# Patient Record
Sex: Male | Born: 1960 | Race: White | Hispanic: No | Marital: Married | Smoking: Never smoker
Health system: Southern US, Community
[De-identification: ages and names within clinical notes are randomized; demographics above are authoritative.]

## PROBLEM LIST (undated history)

## (undated) DIAGNOSIS — F329 Major depressive disorder, single episode, unspecified: Secondary | ICD-10-CM

## (undated) DIAGNOSIS — I10 Essential (primary) hypertension: Secondary | ICD-10-CM

## (undated) DIAGNOSIS — E785 Hyperlipidemia, unspecified: Secondary | ICD-10-CM

## (undated) DIAGNOSIS — K219 Gastro-esophageal reflux disease without esophagitis: Secondary | ICD-10-CM

## (undated) DIAGNOSIS — F528 Other sexual dysfunction not due to a substance or known physiological condition: Secondary | ICD-10-CM

## (undated) HISTORY — DX: Essential (primary) hypertension: I10

## (undated) HISTORY — DX: Gastro-esophageal reflux disease without esophagitis: K21.9

## (undated) HISTORY — DX: Major depressive disorder, single episode, unspecified: F32.9

## (undated) HISTORY — DX: Other sexual dysfunction not due to a substance or known physiological condition: F52.8

## (undated) HISTORY — DX: Hyperlipidemia, unspecified: E78.5

---

## 2000-04-10 ENCOUNTER — Ambulatory Visit (HOSPITAL_COMMUNITY): Admission: RE | Admit: 2000-04-10 | Discharge: 2000-04-10 | Payer: Self-pay | Admitting: Specialist

## 2000-04-10 ENCOUNTER — Encounter: Payer: Self-pay | Admitting: Specialist

## 2004-07-05 ENCOUNTER — Ambulatory Visit (HOSPITAL_COMMUNITY): Admission: RE | Admit: 2004-07-05 | Discharge: 2004-07-05 | Payer: Self-pay | Admitting: Urology

## 2004-07-27 ENCOUNTER — Emergency Department (HOSPITAL_COMMUNITY): Admission: EM | Admit: 2004-07-27 | Discharge: 2004-07-27 | Payer: Self-pay | Admitting: Emergency Medicine

## 2004-08-15 HISTORY — PX: LITHOTRIPSY: SUR834

## 2004-08-24 ENCOUNTER — Inpatient Hospital Stay (HOSPITAL_COMMUNITY): Admission: RE | Admit: 2004-08-24 | Discharge: 2004-08-27 | Payer: Self-pay | Admitting: Urology

## 2004-08-30 ENCOUNTER — Ambulatory Visit (HOSPITAL_COMMUNITY): Admission: RE | Admit: 2004-08-30 | Discharge: 2004-08-30 | Payer: Self-pay | Admitting: Urology

## 2005-03-18 ENCOUNTER — Encounter: Admission: RE | Admit: 2005-03-18 | Discharge: 2005-03-18 | Payer: Self-pay | Admitting: Occupational Medicine

## 2005-06-01 ENCOUNTER — Ambulatory Visit: Payer: Self-pay | Admitting: Internal Medicine

## 2005-06-13 ENCOUNTER — Ambulatory Visit: Payer: Self-pay | Admitting: Internal Medicine

## 2005-08-18 IMAGING — RF DG NEPHROSTOGRAM LEFT
14 of 22 series · 14 of 22 positions shown · non-contrast
Comparison: Left nephrostomy tube placement 08/24/2004.

CLINICAL DATA: 43 year-old male status-post left percutaneous nephrolithotomy 08/24/2004.  Request to perform left nephrostogram and if within normal limits remove 22 French council tip catheter.
LEFT NEPHROSTOGRAM ? 08/31/2003:

[Series 1: run · 1 of 1 slices shown (1 of 13)]
[im 1/1]
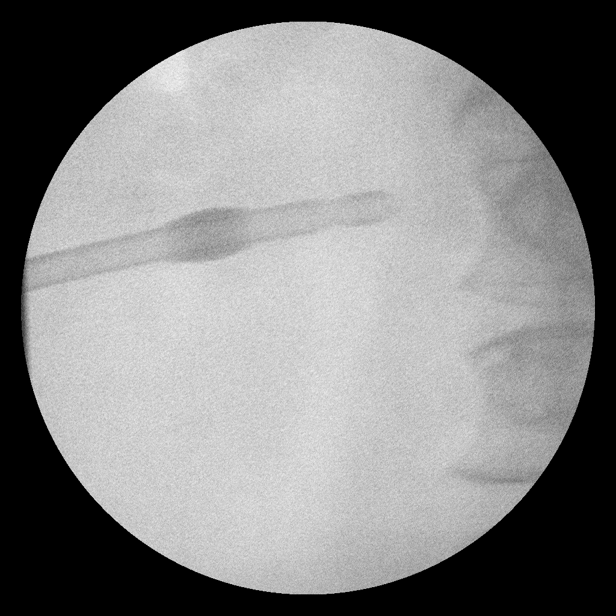

[Series 3: run · 1 of 1 slices shown (2 of 13)]
[im 1/1]
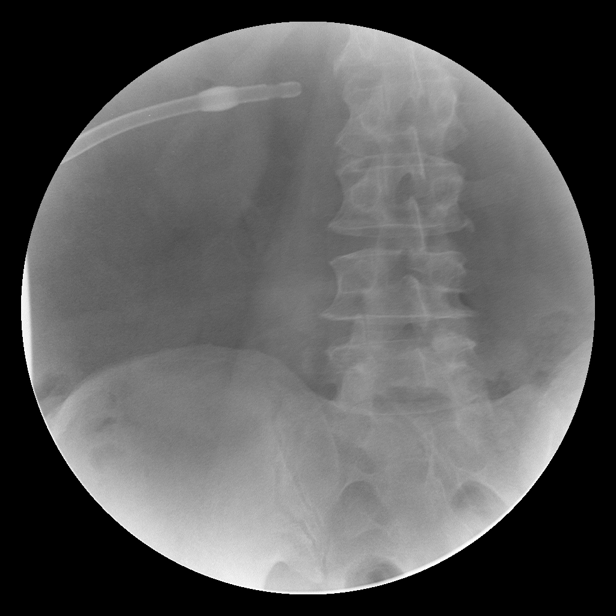

[Series 4: run · 1 of 1 slices shown (3 of 13)]
[im 1/1]
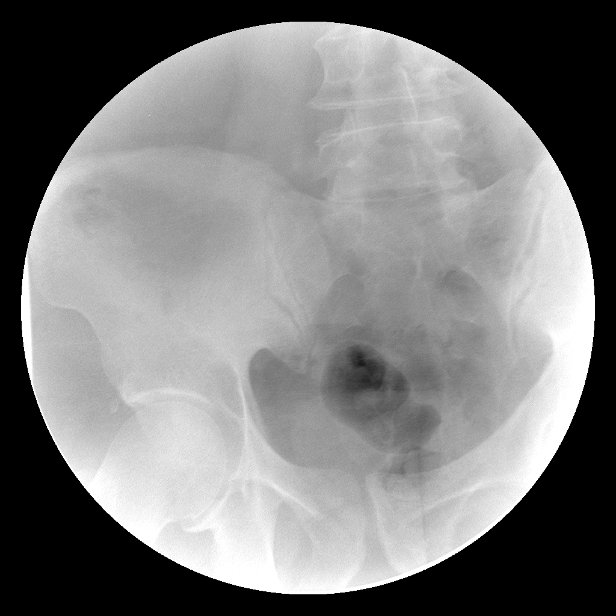

[Series 6: run · 1 of 1 slices shown (4 of 13)]
[im 1/1]
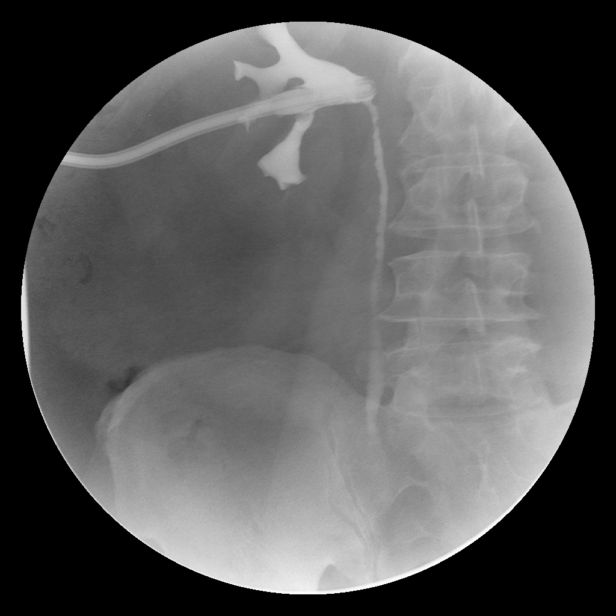

[Series 8: run · 1 of 1 slices shown (5 of 13)]
[im 1/1]
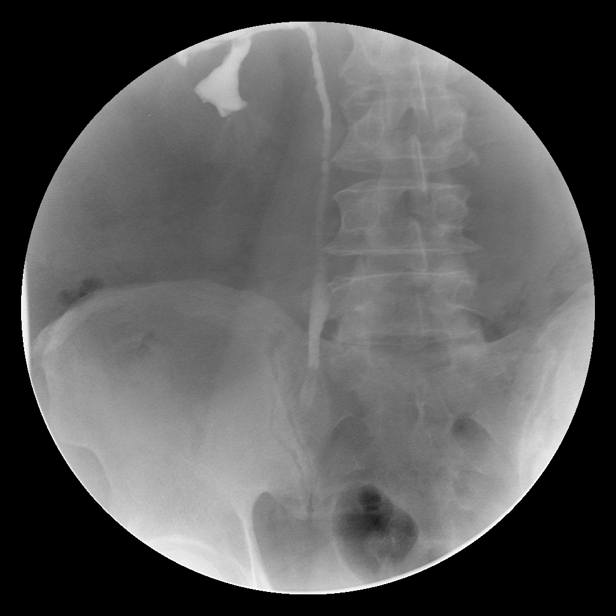

[Series 9: run · 1 of 1 slices shown (6 of 13)]
[im 1/1]
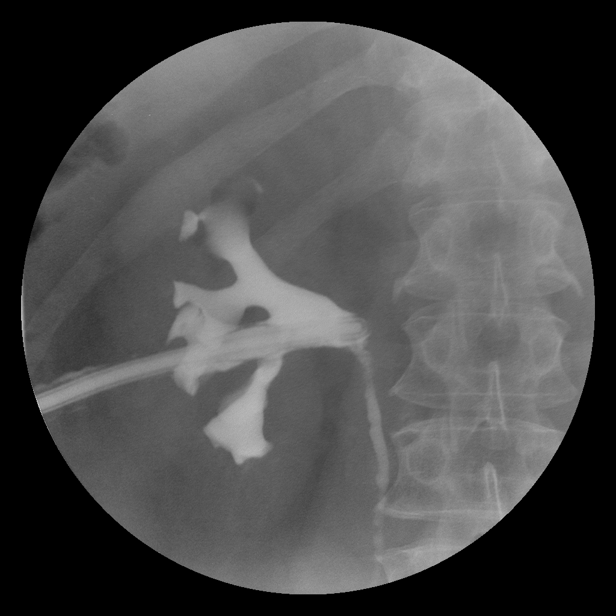

[Series 11: run · 1 of 1 slices shown (7 of 13)]
[im 1/1]
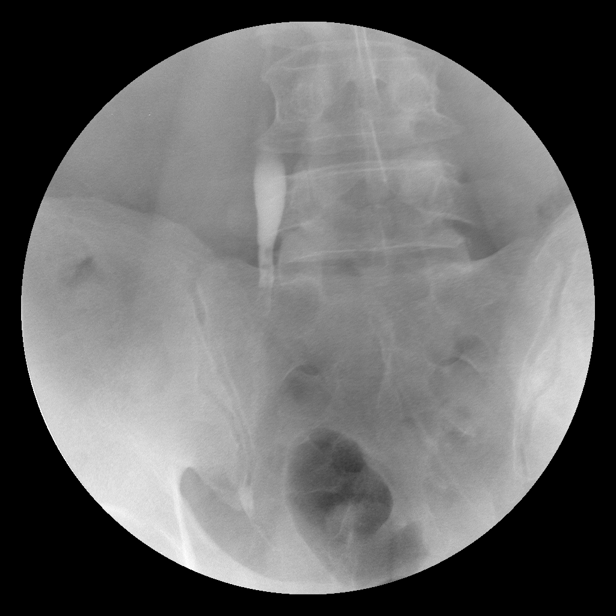

[Series 12: run · 1 of 1 slices shown (8 of 13)]
[im 1/1]
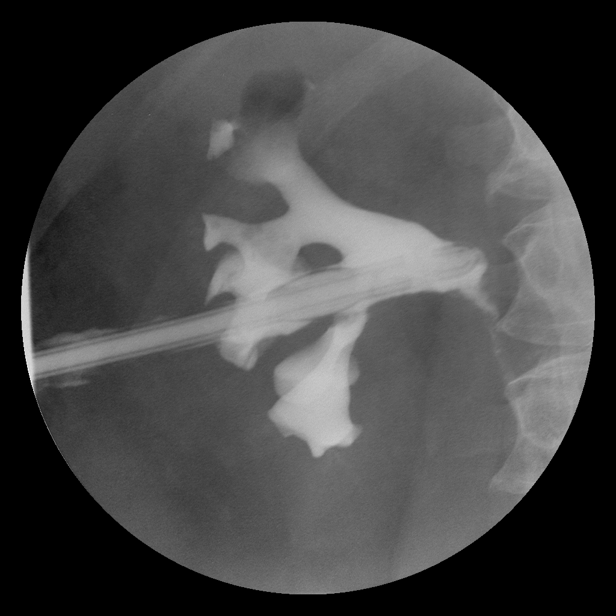

[Series 14: run · 1 of 1 slices shown (9 of 13)]
[im 1/1]
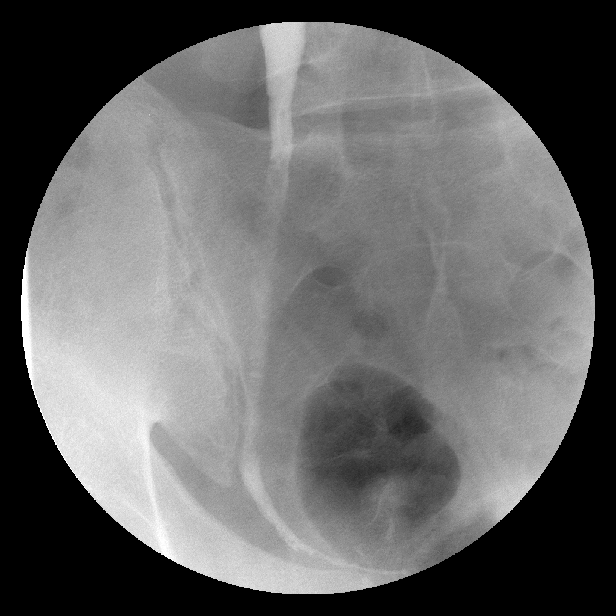

[Series 15: run · 1 of 1 slices shown (10 of 13)]
[im 1/1]
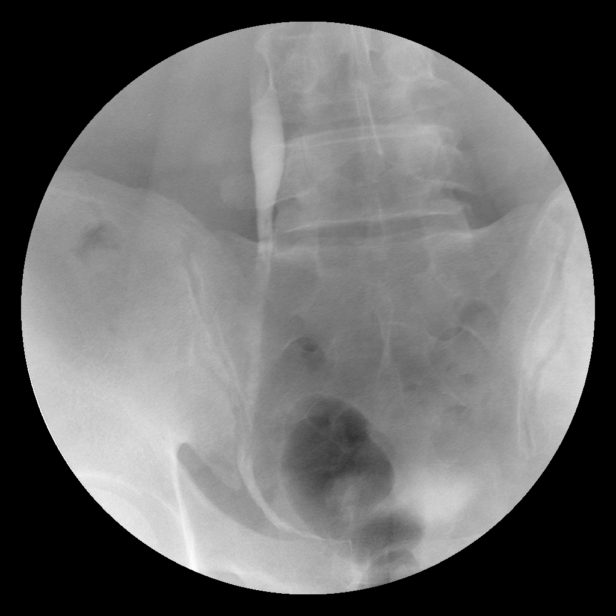

[Series 17: run · 1 of 1 slices shown (11 of 13)]
[im 1/1]
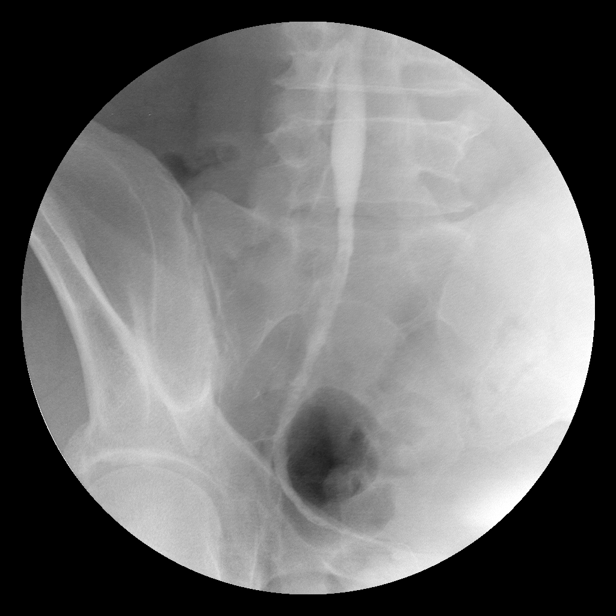

[Series 19: run · 1 of 1 slices shown (12 of 13)]
[im 1/1]
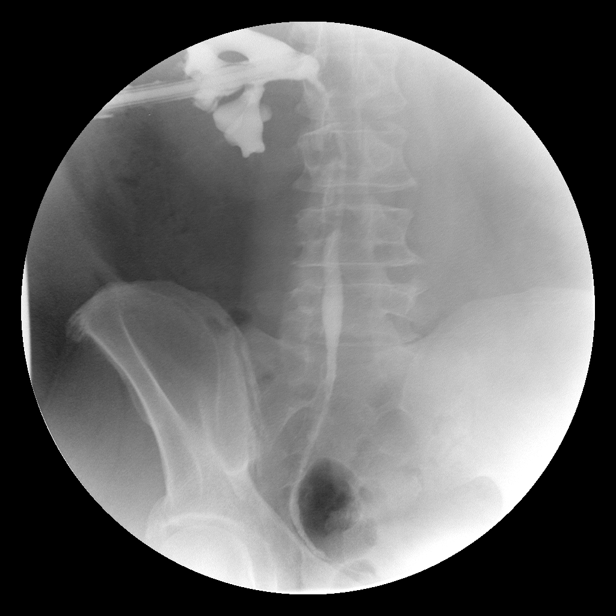

[Series 20: run · 1 of 1 slices shown (13 of 13)]
[im 1/1]
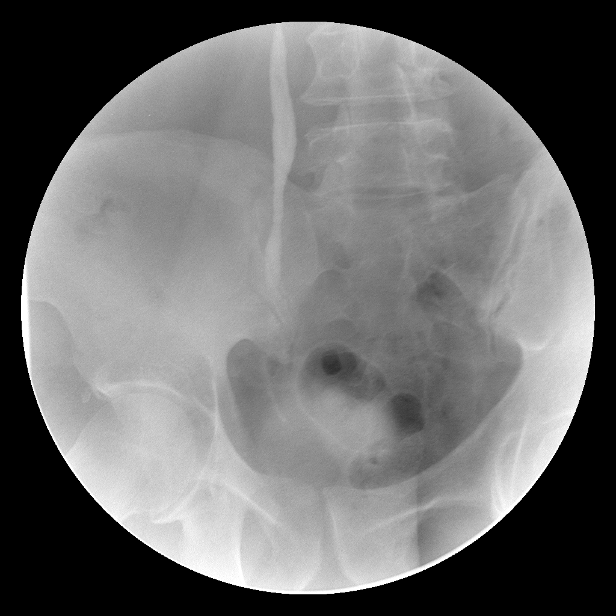

[Series 1001: view not recorded · 0.20mm/px · 1 of 1 slices shown]
[im 1/1]
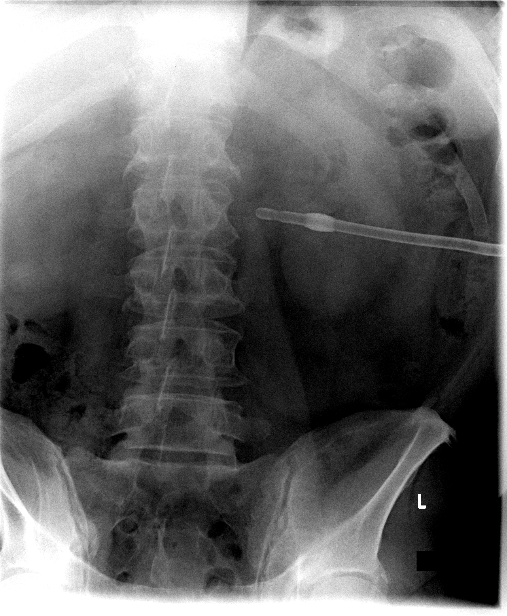

[14 of 22 positions shown; findings below may reference images not displayed]

Procedure:  LEFT NEPHROSTOGRAM UNDER FLUOROSCOPY -  08/30/2004.
Written informed consent was obtained from the patient prior to the procedure.  
A scout film was obtained prior to contrast injection which demonstrated the existing 22 French Council catheter within the left flank. Contrast was then injected which confirmed the tip of the catheter within the left renal pelvis.  No significant filling defects were identified.  ercutaneous sutures were removed and the balloon deflated prior to removal of the catheter. A sterile dressing was applied and verbal instructions were given to the patient. The patient tolerated the procedure well and there were no immediate complications.
Dr. Leandro Karga Uomuto supervised the above procedure and reviewed the images prior to catheter removal.
Dr. Basin Manu was notified of the above findings post-procedure.
IMPRESSION: 1. Left nephrostogram demonstrates contrast from the renal pelvis through the ureter and into the patient?s bladder.  No significant filling defects identified.  
2. Successful removal of a 22 French council tip left nephrostomy catheter.

## 2005-09-20 ENCOUNTER — Ambulatory Visit: Payer: Self-pay | Admitting: Internal Medicine

## 2007-04-04 DIAGNOSIS — E119 Type 2 diabetes mellitus without complications: Secondary | ICD-10-CM

## 2007-04-09 ENCOUNTER — Ambulatory Visit: Payer: Self-pay | Admitting: Family Medicine

## 2007-04-09 ENCOUNTER — Encounter: Payer: Self-pay | Admitting: Internal Medicine

## 2007-04-09 DIAGNOSIS — R079 Chest pain, unspecified: Secondary | ICD-10-CM

## 2007-04-12 ENCOUNTER — Ambulatory Visit: Payer: Self-pay

## 2007-04-12 ENCOUNTER — Encounter: Payer: Self-pay | Admitting: Family Medicine

## 2007-06-15 ENCOUNTER — Ambulatory Visit: Payer: Self-pay | Admitting: Internal Medicine

## 2007-11-08 ENCOUNTER — Ambulatory Visit: Payer: Self-pay | Admitting: Internal Medicine

## 2007-11-08 DIAGNOSIS — Z87442 Personal history of urinary calculi: Secondary | ICD-10-CM

## 2007-11-08 DIAGNOSIS — F528 Other sexual dysfunction not due to a substance or known physiological condition: Secondary | ICD-10-CM

## 2007-11-08 HISTORY — DX: Other sexual dysfunction not due to a substance or known physiological condition: F52.8

## 2008-04-15 ENCOUNTER — Ambulatory Visit: Payer: Self-pay | Admitting: Internal Medicine

## 2008-04-15 DIAGNOSIS — J069 Acute upper respiratory infection, unspecified: Secondary | ICD-10-CM | POA: Insufficient documentation

## 2008-04-15 LAB — CONVERTED CEMR LAB: Hgb A1c MFr Bld: 12.2 % — ABNORMAL HIGH (ref 4.6–6.0)

## 2008-05-16 ENCOUNTER — Inpatient Hospital Stay (HOSPITAL_COMMUNITY): Admission: RE | Admit: 2008-05-16 | Discharge: 2008-05-23 | Payer: Self-pay | Admitting: Urology

## 2008-05-23 ENCOUNTER — Telehealth: Payer: Self-pay | Admitting: Internal Medicine

## 2008-06-10 ENCOUNTER — Encounter: Payer: Self-pay | Admitting: Internal Medicine

## 2008-06-30 ENCOUNTER — Ambulatory Visit: Payer: Self-pay | Admitting: Internal Medicine

## 2008-07-01 ENCOUNTER — Encounter: Payer: Self-pay | Admitting: Internal Medicine

## 2008-07-22 ENCOUNTER — Ambulatory Visit: Payer: Self-pay | Admitting: Internal Medicine

## 2008-07-22 DIAGNOSIS — J019 Acute sinusitis, unspecified: Secondary | ICD-10-CM | POA: Insufficient documentation

## 2008-07-29 ENCOUNTER — Telehealth: Payer: Self-pay | Admitting: Internal Medicine

## 2008-09-08 ENCOUNTER — Telehealth: Payer: Self-pay | Admitting: Internal Medicine

## 2008-09-08 ENCOUNTER — Encounter: Payer: Self-pay | Admitting: Internal Medicine

## 2009-01-06 ENCOUNTER — Telehealth: Payer: Self-pay | Admitting: Internal Medicine

## 2009-01-06 ENCOUNTER — Ambulatory Visit: Payer: Self-pay | Admitting: Internal Medicine

## 2009-01-06 DIAGNOSIS — I10 Essential (primary) hypertension: Secondary | ICD-10-CM | POA: Insufficient documentation

## 2009-01-06 HISTORY — DX: Essential (primary) hypertension: I10

## 2009-01-06 LAB — CONVERTED CEMR LAB: Hgb A1c MFr Bld: 11.7 % — ABNORMAL HIGH (ref 4.6–6.5)

## 2009-02-02 ENCOUNTER — Ambulatory Visit: Payer: Self-pay | Admitting: Internal Medicine

## 2009-02-02 LAB — CONVERTED CEMR LAB: Hgb A1c MFr Bld: 9.3 % — ABNORMAL HIGH (ref 4.6–6.5)

## 2009-02-05 ENCOUNTER — Ambulatory Visit: Payer: Self-pay | Admitting: Internal Medicine

## 2009-08-15 HISTORY — PX: OTHER SURGICAL HISTORY: SHX169

## 2009-09-12 ENCOUNTER — Emergency Department (HOSPITAL_COMMUNITY): Admission: EM | Admit: 2009-09-12 | Discharge: 2009-09-12 | Payer: Self-pay | Admitting: Emergency Medicine

## 2010-02-01 ENCOUNTER — Observation Stay (HOSPITAL_COMMUNITY): Admission: EM | Admit: 2010-02-01 | Discharge: 2010-02-02 | Payer: Self-pay | Admitting: Emergency Medicine

## 2010-02-01 ENCOUNTER — Encounter (INDEPENDENT_AMBULATORY_CARE_PROVIDER_SITE_OTHER): Payer: Self-pay | Admitting: Emergency Medicine

## 2010-02-01 ENCOUNTER — Ambulatory Visit: Payer: Self-pay | Admitting: Internal Medicine

## 2010-02-09 ENCOUNTER — Ambulatory Visit: Payer: Self-pay | Admitting: Internal Medicine

## 2010-02-09 DIAGNOSIS — E785 Hyperlipidemia, unspecified: Secondary | ICD-10-CM

## 2010-02-09 HISTORY — DX: Hyperlipidemia, unspecified: E78.5

## 2010-02-09 LAB — CONVERTED CEMR LAB: Blood Glucose, Fingerstick: 170

## 2010-02-17 ENCOUNTER — Ambulatory Visit: Payer: Self-pay | Admitting: Internal Medicine

## 2010-02-17 ENCOUNTER — Emergency Department (HOSPITAL_COMMUNITY): Admission: EM | Admit: 2010-02-17 | Discharge: 2010-02-17 | Payer: Self-pay | Admitting: Emergency Medicine

## 2010-02-18 ENCOUNTER — Ambulatory Visit: Payer: Self-pay | Admitting: Family Medicine

## 2010-02-18 DIAGNOSIS — K219 Gastro-esophageal reflux disease without esophagitis: Secondary | ICD-10-CM | POA: Insufficient documentation

## 2010-02-18 DIAGNOSIS — F329 Major depressive disorder, single episode, unspecified: Secondary | ICD-10-CM | POA: Insufficient documentation

## 2010-02-18 DIAGNOSIS — F3289 Other specified depressive episodes: Secondary | ICD-10-CM

## 2010-02-18 HISTORY — DX: Other specified depressive episodes: F32.89

## 2010-02-18 HISTORY — DX: Gastro-esophageal reflux disease without esophagitis: K21.9

## 2010-02-18 HISTORY — DX: Major depressive disorder, single episode, unspecified: F32.9

## 2010-03-01 ENCOUNTER — Encounter: Payer: Self-pay | Admitting: Internal Medicine

## 2010-03-01 ENCOUNTER — Telehealth: Payer: Self-pay | Admitting: Internal Medicine

## 2010-09-04 ENCOUNTER — Encounter: Payer: Self-pay | Admitting: Obstetrics and Gynecology

## 2010-09-16 NOTE — Assessment & Plan Note (Signed)
Summary: ACID REFLUX/K PT/PS   Vital Signs:  Patient profile:   50 year old male Weight:      262 pounds Temp:     98.7 degrees F oral BP sitting:   130 / 90  (right arm) Cuff size:   regular  Vitals Entered By: Kathrynn Speed CMA (February 18, 2010 12:35 PM) CC: ov acid reflux / src   CC:  ov acid reflux / src.  History of Present Illness: Shakim is a 50 year old, married male, nonsmoker, who comes in today accompanied by his wife for evaluation of multiple issues.  He was hospitalized at [facility] on June the 20th for evaluation of chest pain x 3 days.  Diagnostic workup, including cardiac catheter.......... which showed a 50% lesion and two.  Other minor lesions.  There were deemed not significant......... and he was discharged with no diagnosis.  He subsequently went back to the emergency room on July the fourth for similar chest pain and was diagnosed with reflux esophagitis.  He was advised to take over-the-counter Pepcid.  He is a nonsmoker, minimal, and only occasional drinker, minimal caffeine, and no peppermint.  He also has no insurance and needs refills on medications.  Dr. Kirtland Bouchard. increase to his diabetes medication by adding glyburide 4 mg.  Daily blood sugar now down to 104.  High 170.  Last hemoglobin A1c9 .3%.  He also is complaining of a lot of stress.  He states that last year.  His father died his sister has some problems and he has some problems obviously being out of work however, he does have a new job ...... insurance in 90 days.  Current Medications (verified): 1)  Allopurinol 300 Mg  Tabs (Allopurinol) .... One Daily 2)  Cialis 20 Mg  Tabs (Tadalafil) .... As Directed 3)  Accu-Chek Aviva  Strp (Glucose Blood) .... Use Daily 4)  Losartan Potassium 50 Mg Tabs (Losartan Potassium) .... Qd 5)  Simvastatin 40 Mg Tabs (Simvastatin) .... Qd 6)  Aleve 220 Mg Caps (Naproxen Sodium) .... 2 By Mouth Prn 7)  Aspirin 81 Mg Tbec (Aspirin) .... Qd 8)  Glucophage 1000 Mg Tabs  (Metformin Hcl) .Marland Kitchen.. 1 By Mouth Bid 9)  Centrum  Tabs (Multiple Vitamins-Minerals) .... Qd 10)  Glimepiride 4 Mg Tabs (Glimepiride) .... One Every Morning  Allergies (verified): No Known Drug Allergies  Past History:  Past medical, surgical, family and social histories (including risk factors) reviewed, and no changes noted (except as noted below).  Past Medical History: Reviewed history from 02/09/2010 and no changes required. Diabetes mellitus, type II Nephrolithiasis, hx of Hypertension Coronary Artery Disease Hyperlipidemia  Past Surgical History: Reviewed history from 02/09/2010 and no changes required. mastectomy in 1997 status post left percutaneous nephro lithotripsy 1/06 status post left primary percutaneous nephrosto lithotomy in October 2009 Cardiac cath june 2011  Family History: Reviewed history from 02/09/2010 and no changes required. Family History of CAD Male 1st degree relative <60 Family History Diabetes 1st degree relative Family History Hypertension Father died age 24-Lung Ca Mother age 81- DM2, DJD 1 sister- good health  Social History: Reviewed history from 04/04/2007 and no changes required. Occupation: Surveyor, minerals Married Never Smoked Alcohol use-no  Review of Systems      See HPI  Physical Exam  General:  Well-developed,well-nourished,in no acute distress; alert,appropriate and cooperative throughout examination   Impression & Recommendations:  Problem # 1:  DEPRESSIVE DISORDER (ICD-311) Assessment New  His updated medication list for this problem includes:  Celexa 20 Mg Tabs (Citalopram hydrobromide) .Marland Kitchen... 1 tab @ bedtime  Problem # 2:  GERD (ICD-530.81) Assessment: New  Complete Medication List: 1)  Allopurinol 300 Mg Tabs (Allopurinol) .... One daily 2)  Cialis 20 Mg Tabs (Tadalafil) .... As directed 3)  Accu-chek Aviva Strp (Glucose blood) .... Use daily 4)  Losartan Potassium 50 Mg Tabs (Losartan potassium) .... Qd 5)   Simvastatin 40 Mg Tabs (Simvastatin) .... Qd 6)  Aleve 220 Mg Caps (Naproxen sodium) .... 2 by mouth prn 7)  Aspirin 81 Mg Tbec (Aspirin) .... Qd 8)  Glucophage 1000 Mg Tabs (Metformin hcl) .Marland Kitchen.. 1 by mouth bid 9)  Centrum Tabs (Multiple vitamins-minerals) .... Qd 10)  Glimepiride 4 Mg Tabs (Glimepiride) .... One every morning 11)  Celexa 20 Mg Tabs (Citalopram hydrobromide) .Marland Kitchen.. 1 tab @ bedtime  Patient Instructions: 1)  work hard to get your fasting blood sugar consistently at 100 by taking a medication daily and staying on a carbohydrate free diet and walking 30 minutes daily. 2)  Take OTC Prilosec 20 mg b.i.d. 3)  Begin Celexa 20 mg nightly follow-up with Dr. Kirtland Bouchard  in two weeks 4)  Remember the anti-reflux program......... no alcohol, no caffeine, no peppermint, nothing to eat or drink for 3 hr  Prior to bedtime and sleep on two pillows Prescriptions: CELEXA 20 MG TABS (CITALOPRAM HYDROBROMIDE) 1 tab @ bedtime  #30 x 1   Entered and Authorized by:   Roderick Pee MD   Signed by:   Roderick Pee MD on 02/18/2010   Method used:   Print then Give to Patient   RxID:   [id number] LOSARTAN POTASSIUM 50 MG TABS (LOSARTAN POTASSIUM) qd  #90 x 3   Entered and Authorized by:   Roderick Pee MD   Signed by:   Roderick Pee MD on 02/18/2010   Method used:   Print then Give to Patient   RxID:   [id number] ALLOPURINOL 300 MG  TABS (ALLOPURINOL) one daily  #90 x 3   Entered and Authorized by:   Roderick Pee MD   Signed by:   Roderick Pee MD on 02/18/2010   Method used:   Print then Give to Patient   RxID:   [id number] ALLOPURINOL 300 MG  TABS (ALLOPURINOL) one daily  #90 x 3   Entered and Authorized by:   Roderick Pee MD   Signed by:   Roderick Pee MD on 02/18/2010   Method used:   Electronically to        [facility] [phone]* (retail)       [address] [place], [place]  [address]       Ph: [phone] or [phone]       Fax:  [phone]   RxID:   [id number] CELEXA 20 MG TABS (CITALOPRAM HYDROBROMIDE) 1 tab @ bedtime  #30 x 1   Entered and Authorized by:   Roderick Pee MD   Signed by:   Roderick Pee MD on 02/18/2010   Method used:   Electronically to        [facility] [phone]* (retail)       [address] [place], [place]  [address]       Ph: [phone] or [phone]  Fax: [phone]   RxID:   [id number]

## 2010-09-16 NOTE — Letter (Signed)
Summary: Medical Review for [facility]  Medical Review for DMV   Imported By: Maryln Gottron 03/02/2010 13:10:44  _____________________________________________________________________  External Attachment:    Type:   Image     Comment:   External Document

## 2010-09-16 NOTE — Letter (Signed)
Summary: Out of Work  Adult nurse at [employer]  [address]   [place], [place] [address]   Phone: [phone]  Fax: [phone]    February 09, 2010   Employee:  Overton A Kimmons    To Whom It May Concern:   For Medical reasons, please excuse the above named employee from work for the following dates:  Start:   02-01-2010  End:   02-16-2010  If you need additional information, please feel free to contact our office.  Mr. Mederos may return to work full time without restrictions         Sincerely,    Gordy Savers  MD

## 2010-09-16 NOTE — Progress Notes (Signed)
Summary: [facility]  Phone Note Outgoing Call   Call placed by: Duard Brady LPN,  March 01, 2010 8:34 AM Call placed to: Patient Summary of Call: [facility] ready for pick up   KIK Initial call taken by: Duard Brady LPN,  March 01, 2010 8:35 AM

## 2010-09-16 NOTE — Assessment & Plan Note (Signed)
Summary: POST HOSP F/U (CP, HEART CATH) // RS   Vital Signs:  Patient profile:   50 year old male Height:      68.5 inches Weight:      266 pounds BMI:     40.00 Temp:     98.2 degrees F oral BP sitting:   120 / 78  (right arm) Cuff size:   regular  Vitals Entered By: Duard Brady LPN (February 09, 2010 8:19 AM) CC: post hospital - DOT cpx Is Patient Diabetic? Yes Did you bring your meter with you today? No CBG Result 170   CC:  post hospital - DOT cpx.  History of Present Illness: 50 year old patient who is seen today for a DOT physical and also postop from a recent heart catheterization.  He has type 2 diabetes, hypertension, and recently has been placed on simvastatin.  Heart catheterization revealed a 50% mid LAD lesion.  He has not been seen here in over one year due to lack of health insurance.  His prior hemoglobin A1c's have been poorly controlled.  His weight is 266, but he has been attempting to lose weight with the [employer].  Preventive Screening-Counseling & Management  Alcohol-Tobacco     Smoking Status: never  Allergies (verified): No Known Drug Allergies  Past History:  Past Medical History: Diabetes mellitus, type II Nephrolithiasis, hx of Hypertension Coronary Artery Disease Hyperlipidemia  Past Surgical History: mastectomy in 1997 status post left percutaneous nephro lithotripsy 1/06 status post left primary percutaneous nephrosto lithotomy in October 2009 Cardiac cath june 2011  Family History: Reviewed history from 04/09/2007 and no changes required. Family History of CAD Male 1st degree relative <60 Family History Diabetes 1st degree relative Family History Hypertension Father died age 45-Lung Ca Mother age 57- DM2, DJD 1 sister- good health  Social History: Reviewed history from 04/04/2007 and no changes required. Occupation: Surveyor, minerals Married Never Smoked Alcohol use-no  Review of Systems  The patient denies  anorexia, fever, weight loss, weight gain, vision loss, decreased hearing, hoarseness, chest pain, syncope, dyspnea on exertion, peripheral edema, prolonged cough, headaches, hemoptysis, abdominal pain, melena, hematochezia, severe indigestion/heartburn, hematuria, incontinence, genital sores, muscle weakness, suspicious skin lesions, transient blindness, difficulty walking, depression, unusual weight change, abnormal bleeding, enlarged lymph nodes, angioedema, breast masses, and testicular masses.    Physical Exam  General:  overweight-appearing.  110/70 Head:  Normocephalic and atraumatic without obvious abnormalities. No apparent alopecia or balding. Eyes:  No corneal or conjunctival inflammation noted. EOMI. Perrla. Funduscopic exam benign, without hemorrhages, exudates or papilledema. Vision grossly normal. Ears:  External ear exam shows no significant lesions or deformities.  Otoscopic examination reveals clear canals, tympanic membranes are intact bilaterally without bulging, retraction, inflammation or discharge. Hearing is grossly normal bilaterally. Nose:  External nasal examination shows no deformity or inflammation. Nasal mucosa are pink and moist without lesions or exudates. Mouth:  Oral mucosa and oropharynx without lesions or exudates.  Teeth in good repair. Neck:  No deformities, masses, or tenderness noted. Chest Wall:  No deformities, masses, tenderness or gynecomastia noted. Breasts:  No masses or gynecomastia noted Lungs:  Normal respiratory effort, chest expands symmetrically. Lungs are clear to auscultation, no crackles or wheezes. Heart:  Normal rate and regular rhythm. S1 and S2 normal without gallop, murmur, click, rub or other extra sounds. Abdomen:  Bowel sounds positive,abdomen soft and non-tender without masses, organomegaly or hernias noted. Genitalia:  Testes bilaterally descended without nodularity, tenderness or masses. No scrotal masses or lesions.  No penis lesions  or urethral discharge. Msk:  No deformity or scoliosis noted of thoracic or lumbar spine.   Pulses:  R and L carotid,radial,femoral,dorsalis pedis and posterior tibial pulses are full and equal bilaterally Extremities:  No clubbing, cyanosis, edema, or deformity noted with normal full range of motion of all joints.   Neurologic:  No cranial nerve deficits noted. Station and gait are normal. Plantar reflexes are down-going bilaterally. DTRs are symmetrical throughout. Sensory, motor and coordinative functions appear intact. Skin:  Intact without suspicious lesions or rashes Cervical Nodes:  No lymphadenopathy noted Axillary Nodes:  No palpable lymphadenopathy Inguinal Nodes:  No significant adenopathy Psych:  Cognition and judgment appear intact. Alert and cooperative with normal attention span and concentration. No apparent delusions, illusions, hallucinations  Diabetes Management Exam:    Foot Exam (with socks and/or shoes not present):       Sensory-Pinprick/Light touch:          Left medial foot (L-4): normal          Left dorsal foot (L-5): normal          Left lateral foot (S-1): normal          Right medial foot (L-4): normal          Right dorsal foot (L-5): normal          Right lateral foot (S-1): normal       Sensory-Monofilament:          Left foot: normal          Right foot: normal       Inspection:          Left foot: normal          Right foot: normal       Nails:          Left foot: normal          Right foot: normal    Foot Exam by Podiatrist:       Date: 02/09/2010       Results: no diabetic findings       Done by: PCP    Eye Exam:       Eye Exam done here today          Results: normal   Impression & Recommendations:  Problem # 1:  HYPERLIPIDEMIA (ICD-272.4)  His updated medication list for this problem includes:    Simvastatin 40 Mg Tabs (Simvastatin) ..... Qd  His updated medication list for this problem includes:    Simvastatin 40 Mg Tabs  (Simvastatin) ..... Qd  Orders: Venipuncture ([address]) TLB-AST (SGOT) (84450-SGOT) TLB-Cholesterol, Total (82465-CHO)  Problem # 2:  HYPERTENSION (ICD-401.9)  The following medications were removed from the medication list:    Benazepril Hcl 20 Mg Tabs (Benazepril hcl) ..... One tablet daily His updated medication list for this problem includes:    Losartan Potassium 50 Mg Tabs (Losartan potassium) ..... Qd  The following medications were removed from the medication list:    Benazepril Hcl 20 Mg Tabs (Benazepril hcl) ..... One tablet daily His updated medication list for this problem includes:    Losartan Potassium 50 Mg Tabs (Losartan potassium) ..... Qd  Problem # 3:  DIABETES MELLITUS, TYPE II (ICD-250.00)  The following medications were removed from the medication list:    Bayer Aspirin 325 Mg Tabs (Aspirin) ..... Once daily    Glimepiride 4 Mg Tabs (Glimepiride) ..... One daily    Benazepril Hcl 20 Mg Tabs (  Benazepril hcl) ..... One tablet daily    Actos 45 Mg Tabs (Pioglitazone hcl) His updated medication list for this problem includes:    Losartan Potassium 50 Mg Tabs (Losartan potassium) ..... Qd    Aspirin 81 Mg Tbec (Aspirin) ..... Qd    Glucophage 1000 Mg Tabs (Metformin hcl) .Marland Kitchen... 1 by mouth bid    Glimepiride 4 Mg Tabs (Glimepiride) ..... One every morning  Orders: Capillary Blood Glucose/CBG ([address])  The following medications were removed from the medication list:    Bayer Aspirin 325 Mg Tabs (Aspirin) ..... Once daily    Glimepiride 4 Mg Tabs (Glimepiride) ..... One daily    Benazepril Hcl 20 Mg Tabs (Benazepril hcl) ..... One tablet daily    Actos 45 Mg Tabs (Pioglitazone hcl) His updated medication list for this problem includes:    Losartan Potassium 50 Mg Tabs (Losartan potassium) ..... Qd    Aspirin 81 Mg Tbec (Aspirin) ..... Qd    Glucophage 1000 Mg Tabs (Metformin hcl) .Marland Kitchen... 1 by mouth bid  Problem # 4:  Preventive Health Care (ICD-V70.0)  Complete  Medication List: 1)  Allopurinol 300 Mg Tabs (Allopurinol) .... One daily 2)  Cialis 20 Mg Tabs (Tadalafil) .... As directed 3)  Accu-chek Aviva Strp (Glucose blood) .... Use daily 4)  Losartan Potassium 50 Mg Tabs (Losartan potassium) .... Qd 5)  Simvastatin 40 Mg Tabs (Simvastatin) .... Qd 6)  Aleve 220 Mg Caps (Naproxen sodium) .... 2 by mouth prn 7)  Aspirin 81 Mg Tbec (Aspirin) .... Qd 8)  Glucophage 1000 Mg Tabs (Metformin hcl) .Marland Kitchen.. 1 by mouth bid 9)  Centrum Tabs (Multiple vitamins-minerals) .... Qd 10)  Glimepiride 4 Mg Tabs (Glimepiride) .... One every morning  Other Orders: TLB-A1C / Hgb A1C (Glycohemoglobin) (83036-A1C)  Patient Instructions: 1)  Please schedule a follow-up appointment in 3 months. 2)  Limit your Sodium (Salt). 3)  It is important that you exercise regularly at least 20 minutes 5 times a week. If you develop chest pain, have severe difficulty breathing, or feel very tired , stop exercising immediately and seek medical attention. 4)  You need to lose weight. Consider a lower calorie diet and regular exercise.  5)  Check your blood sugars regularly. If your readings are usually above : or below 70 you should contact our office. 6)  It is important that your Diabetic A1c level is checked every 3 months. 7)  See your eye doctor yearly to check for diabetic eye damage. Prescriptions: GLIMEPIRIDE 4 MG TABS (GLIMEPIRIDE) one every morning  #90 x 6   Entered and Authorized by:   Gordy Savers  MD   Signed by:   Gordy Savers  MD on 02/09/2010   Method used:   Print then Give to Patient   RxID:   [id number] GLIMEPIRIDE 4 MG TABS (GLIMEPIRIDE) one every morning  #90 x 6   Entered and Authorized by:   Gordy Savers  MD   Signed by:   Gordy Savers  MD on 02/09/2010   Method used:   Electronically to        [facility] [phone]* (retail)       [address] [place], [place]  [address]       Ph: [phone]  or [phone]       Fax: [phone]   RxID:   [id number] GLUCOPHAGE 1000 MG TABS (  METFORMIN HCL) 1 by mouth bid  #180 x 6   Entered and Authorized by:   Gordy Savers  MD   Signed by:   Gordy Savers  MD on 02/09/2010   Method used:   Electronically to        [facility] [phone]* (retail)       [address] [place], [place]  [address]       Ph: [phone] or [phone]       Fax: [phone]   RxID:   [id number] SIMVASTATIN 40 MG TABS (SIMVASTATIN) qd  #90 x 2   Entered and Authorized by:   Gordy Savers  MD   Signed by:   Gordy Savers  MD on 02/09/2010   Method used:   Electronically to        [facility] [phone]* (retail)       [address] [place], [place]  [address]       Ph: [phone] or [phone]       Fax: [phone]   RxID:   [id number] LOSARTAN POTASSIUM 50 MG TABS (LOSARTAN POTASSIUM) qd  #90 x 6   Entered and Authorized by:   Gordy Savers  MD   Signed by:   Gordy Savers  MD on 02/09/2010   Method used:   Electronically to        [facility] [phone]* (retail)       [address]       [place], [place]  [address]       Ph: [phone] or [phone]       Fax: [phone]   RxID:   [id number] CIALIS 20 MG  TABS (TADALAFIL) as directed  #12 x 5   Entered and Authorized by:   Gordy Savers  MD   Signed by:   Gordy Savers  MD on 02/09/2010   Method used:   Electronically to        [facility] [phone]* (retail)       [address] [place], [place]  [address]       Ph: [phone] or [phone]       Fax: [phone]   RxID:   [id number] ALLOPURINOL 300 MG  TABS (ALLOPURINOL) one daily  #90 x 5   Entered and Authorized by:   Gordy Savers  MD   Signed by:   Gordy Savers  MD on 02/09/2010   Method used:   Electronically to        [facility] [phone]*  (retail)       [address] [place], [place]  [address]       Ph: [phone] or [phone]       Fax: [phone]   RxID:   [id number]

## 2010-09-16 NOTE — Letter (Signed)
Summary: Out of Work  Adult nurse at [employer]  [address]   [place], [place] [address]   Phone: [phone]  Fax: [phone]    February 09, 2010   Employee:  Chukwudi A Capozzi    To Whom It May Concern:   For Medical reasons, please excuse the above named employee from work for the following dates:  Start:    End:    If you need additional information, please feel free to contact our office.         Sincerely,    Gordy Savers  MD

## 2010-09-16 NOTE — Letter (Signed)
Summary: Work Dietitian at [employer]  [address]   [place], [place] [address]   Phone: [phone]  Fax: [phone]    Today's Date: February 18, 2010  Name of Patient: [name]  The above named patient had a medical visit today at:  am / pm.  Please take this into consideration when reviewing the time away from work/school.    Special Instructions:  [  ] None  [  ] To be off the remainder of today, returning to the normal work / school schedule tomorrow.  [  ] To be off until the next scheduled appointment on ______________________.  [  ] Other ____ February 18, 2010 returning on  February 22, 2010.  He may return full time with no restrictions.  If you have any concerns or questions please feel free to call our office.   Sincerely yours,   Kelle Darting, MD

## 2010-09-21 ENCOUNTER — Other Ambulatory Visit: Payer: Self-pay | Admitting: *Deleted

## 2010-09-21 MED ORDER — SILDENAFIL CITRATE 100 MG PO TABS: 100.0000 mg | ORAL_TABLET | ORAL | Status: DC | PRN

## 2010-09-21 MED ORDER — SILDENAFIL CITRATE 100 MG PO TABS: 100.0000 mg | ORAL_TABLET | ORAL | Status: AC | PRN

## 2010-09-21 NOTE — Telephone Encounter (Signed)
Cialis is not working and wants Viagra.

## 2010-09-21 NOTE — Telephone Encounter (Signed)
Viagra 100 mg O. K.  Number 20  Refill  4;  No pharmacy of record

## 2010-09-22 ENCOUNTER — Telehealth: Payer: Self-pay

## 2010-09-22 NOTE — Telephone Encounter (Signed)
Spoke with pt - rx -viagra  - ready for pick up - pt state he went to pharmacy and rx was $280. He will call pharmacy and see the cost on the others , and let us know what he may want to do. KIK

## 2010-10-19 ENCOUNTER — Encounter: Payer: Self-pay | Admitting: Internal Medicine

## 2010-10-20 ENCOUNTER — Ambulatory Visit (INDEPENDENT_AMBULATORY_CARE_PROVIDER_SITE_OTHER): Payer: Self-pay | Admitting: Family Medicine

## 2010-10-20 ENCOUNTER — Encounter: Payer: Self-pay | Admitting: Family Medicine

## 2010-10-20 DIAGNOSIS — J019 Acute sinusitis, unspecified: Secondary | ICD-10-CM

## 2010-10-20 MED ORDER — AMOXICILLIN 875 MG PO TABS: 875.0000 mg | ORAL_TABLET | Freq: Two times a day (BID) | ORAL | Status: AC

## 2010-10-20 NOTE — Progress Notes (Signed)
  Subjective:    Patient ID: [name], male    DOB: [birth year], 50 y.o.   MRN: [id number]  HPI  Patient seen with 3 week history of intermittent sore throat, cough and progressive nasal congestion and facial pain maxillary region bilaterally. Recent color changes with some green to yellow nasal mucus. Increase malaise. No history of allergies. Hasn't been able to work this week. Nonsmoker. No recent nausea, vomiting, or diarrhea.   Review of Systems  Constitutional: Positive for fatigue. Negative for fever and chills.  HENT: Positive for congestion, sore throat, postnasal drip and sinus pressure. Negative for neck stiffness.   Respiratory: Positive for cough.   Cardiovascular: Negative for chest pain.  Neurological: Positive for headaches. Negative for dizziness.       Objective:   Physical Exam  patient is alert nontoxic in appearance. He is afebrile Oropharynx is moist and clear Eardrums no acute change  right and left naris reveal thick yellow mucus with some erythematous nasal mucosa no nasal polyps. Neck supple no adenopathy Chest clear to auscultation       Assessment & Plan:   probable acute sinusitis. Amoxicillin 875 mg twice a day for 10 days

## 2010-10-26 ENCOUNTER — Telehealth: Payer: Self-pay | Admitting: *Deleted

## 2010-10-26 NOTE — Telephone Encounter (Signed)
I called pt to sch ov, but pt said that he does not want ov. He just needs a note for his work to return on Monday 11/01/10. Pls call when note is ready for pick.

## 2010-10-26 NOTE — Telephone Encounter (Signed)
Needs to be seen if no better-  

## 2010-10-26 NOTE — Telephone Encounter (Signed)
Pt was seen by Dr. Caryl Never last week and given note to return to work today.  Pt still no better and needs work note extended to next Monday 11/01/10.

## 2010-10-26 NOTE — Telephone Encounter (Signed)
Okey Regal or Aram Beecham, please call pt for OV.  Thank you

## 2010-10-27 ENCOUNTER — Telehealth: Payer: Self-pay | Admitting: *Deleted

## 2010-10-27 NOTE — Telephone Encounter (Signed)
Pt is sch with doc on 10-28-2010

## 2010-10-27 NOTE — Telephone Encounter (Signed)
Pt does not want to come in for another note this week.  Please call to get this straightened out for the pt.

## 2010-10-27 NOTE — Telephone Encounter (Signed)
CALLED PT - HE DOES HAVE APPT TOMORROW AT 3PM WITH Dr. Caryl Never.

## 2010-10-27 NOTE — Telephone Encounter (Signed)
Pt has appt tomorrow, Selena Batten spoke with pt, will see Dr Caryl Never

## 2010-10-28 ENCOUNTER — Encounter: Payer: Self-pay | Admitting: Family Medicine

## 2010-10-28 ENCOUNTER — Ambulatory Visit (INDEPENDENT_AMBULATORY_CARE_PROVIDER_SITE_OTHER): Payer: Self-pay | Admitting: Family Medicine

## 2010-10-28 VITALS — BP 140/80 | Temp 98.7°F

## 2010-10-28 DIAGNOSIS — J329 Chronic sinusitis, unspecified: Secondary | ICD-10-CM

## 2010-10-28 DIAGNOSIS — R5383 Other fatigue: Secondary | ICD-10-CM

## 2010-10-28 NOTE — Progress Notes (Signed)
  Subjective:    Patient ID: [name], male    DOB: 1961/01/19, 50 y.o.   MRN: [id number]  HPI  patient seen as a work in requesting extended work note. Recently seen for acute sinusitis. Treated with amoxicillin. Sinus symptoms have improved. Still has rare dry cough but overall that is improved as well. He states major issue now he just  feels fatigued. He is requesting work note through Monday. He denies any nausea, vomiting, diarrhea, appetite change, abdominal pain, dysuria , dyspnea, or chest pain.   Review of Systems As above.    Objective:   Physical Exam     Patient is alert nontoxic in appearance. Repeat blood pressure left arm seated after rest 140/80 Oropharynx is moist and clear Eardrums no acute change Neck supple no adenopathy Chest clear to auscultation Heart regular rhythm and rate    Assessment & Plan:   #1 recent acute sinusitis improved #2 fatigue probably related to #1. Work no written through 11-01-2010. Patient is encouraged to followup with Dr. Kirtland Bouchard. If fatigue persists to

## 2010-10-31 LAB — POCT CARDIAC MARKERS
CKMB, poc: <1 ng/mL — ABNORMAL LOW (ref 1.0–8.0)
Troponin i, poc: <0.05 ng/mL (ref 0.00–0.09)

## 2010-10-31 LAB — LIPID PANEL
HDL: 33 mg/dL — ABNORMAL LOW (ref >39)
LDL Cholesterol: 115 mg/dL — ABNORMAL HIGH (ref 0–99)
Total CHOL/HDL Ratio: 5.4 RATIO
Triglycerides: 148 mg/dL (ref <150)
VLDL: 30 mg/dL (ref 0–40)

## 2010-10-31 LAB — CARDIAC PANEL(CRET KIN+CKTOT+MB+TROPI)
CK, MB: 1.5 ng/mL (ref 0.3–4.0)
CK, MB: 2.3 ng/mL (ref 0.3–4.0)
Relative Index: INVALID (ref 0.0–2.5)
Troponin I: 0.01 ng/mL (ref 0.00–0.06)
Troponin I: 0.06 ng/mL (ref 0.00–0.06)

## 2010-10-31 LAB — TROPONIN I: Troponin I: 0.01 ng/mL (ref 0.00–0.06)

## 2010-10-31 LAB — BASIC METABOLIC PANEL
CO2: 27 mEq/L (ref 19–32)
Calcium: 9.7 mg/dL (ref 8.4–10.5)
Creatinine, Ser: 0.85 mg/dL (ref 0.4–1.5)
GFR calc non Af Amer: >60 mL/min (ref >60)
Glucose, Bld: 180 mg/dL — ABNORMAL HIGH (ref 70–99)
Sodium: 143 mEq/L (ref 135–145)

## 2010-10-31 LAB — COMPREHENSIVE METABOLIC PANEL
ALT: 55 U/L — ABNORMAL HIGH (ref 0–53)
AST: 31 U/L (ref 0–37)
Albumin: 3.2 g/dL — ABNORMAL LOW (ref 3.5–5.2)
Alkaline Phosphatase: 86 U/L (ref 39–117)
BUN: 17 mg/dL (ref 6–23)
BUN: 18 mg/dL (ref 6–23)
CO2: 24 mEq/L (ref 19–32)
Calcium: 8.9 mg/dL (ref 8.4–10.5)
Chloride: 102 mEq/L (ref 96–112)
Creatinine, Ser: 0.89 mg/dL (ref 0.4–1.5)
GFR calc Af Amer: >60 mL/min (ref >60)
GFR calc non Af Amer: >60 mL/min (ref >60)
Glucose, Bld: 214 mg/dL — ABNORMAL HIGH (ref 70–99)
Sodium: 132 mEq/L — ABNORMAL LOW (ref 135–145)
Total Protein: 7.7 g/dL (ref 6.0–8.3)

## 2010-10-31 LAB — PROTIME-INR
INR: 0.96 (ref 0.00–1.49)
Prothrombin Time: 12.7 seconds (ref 11.6–15.2)
Prothrombin Time: 13.1 seconds (ref 11.6–15.2)

## 2010-10-31 LAB — URINE MICROSCOPIC-ADD ON

## 2010-10-31 LAB — D-DIMER, QUANTITATIVE: D-Dimer, Quant: 0.25 ug/mL-FEU (ref 0.00–0.48)

## 2010-10-31 LAB — CBC
HCT: 40.6 % (ref 39.0–52.0)
HCT: 42.1 % (ref 39.0–52.0)
Hemoglobin: 13.2 g/dL (ref 13.0–17.0)
Hemoglobin: 14 g/dL (ref 13.0–17.0)
MCH: 32.9 pg (ref 26.0–34.0)
MCHC: 34.1 g/dL (ref 30.0–36.0)
MCHC: 33.2 g/dL (ref 30.0–36.0)
MCV: 97.2 fL (ref 78.0–100.0)
Platelets: 201 10*3/uL (ref 150–400)
Platelets: 220 10*3/uL (ref 150–400)
RBC: 4.34 MIL/uL (ref 4.22–5.81)
RDW: 15.2 % (ref 11.5–15.5)
RDW: 15 % (ref 11.5–15.5)
RDW: 15 % (ref 11.5–15.5)
WBC: 9.5 10*3/uL (ref 4.0–10.5)

## 2010-10-31 LAB — DIFFERENTIAL
Basophils Absolute: 0 10*3/uL (ref 0.0–0.1)
Basophils Relative: 0 % (ref 0–1)
Basophils Relative: 0 % (ref 0–1)
Eosinophils Absolute: 0.1 10*3/uL (ref 0.0–0.7)
Eosinophils Absolute: 0.1 10*3/uL (ref 0.0–0.7)
Lymphs Abs: 3.6 10*3/uL (ref 0.7–4.0)
Neutro Abs: 5.2 10*3/uL (ref 1.7–7.7)
Neutro Abs: 4.8 10*3/uL (ref 1.7–7.7)
Neutrophils Relative %: 61 % (ref 43–77)
Neutrophils Relative %: 52 % (ref 43–77)

## 2010-10-31 LAB — URINALYSIS, ROUTINE W REFLEX MICROSCOPIC
Glucose, UA: 100 mg/dL — AB
pH: 5.5 (ref 5.0–8.0)

## 2010-10-31 LAB — CK TOTAL AND CKMB (NOT AT ARMC)
CK, MB: 1.8 ng/mL (ref 0.3–4.0)
Total CK: 95 U/L (ref 7–232)

## 2010-10-31 LAB — GLUCOSE, CAPILLARY
Glucose-Capillary: 157 mg/dL — ABNORMAL HIGH (ref 70–99)
Glucose-Capillary: 225 mg/dL — ABNORMAL HIGH (ref 70–99)
Glucose-Capillary: 241 mg/dL — ABNORMAL HIGH (ref 70–99)

## 2010-10-31 LAB — HEMOGLOBIN A1C
Hgb A1c MFr Bld: 10 % — ABNORMAL HIGH (ref <5.7)
Mean Plasma Glucose: 240 mg/dL — ABNORMAL HIGH (ref <117)

## 2010-11-01 LAB — CBC
HCT: 41.3 % (ref 39.0–52.0)
Hemoglobin: 14 g/dL (ref 13.0–17.0)
MCHC: 34 g/dL (ref 30.0–36.0)
Platelets: 206 10*3/uL (ref 150–400)
RDW: 14.6 % (ref 11.5–15.5)

## 2010-11-01 LAB — BASIC METABOLIC PANEL
BUN: 10 mg/dL (ref 6–23)
CO2: 27 mEq/L (ref 19–32)
Calcium: 9.2 mg/dL (ref 8.4–10.5)
Glucose, Bld: 397 mg/dL — ABNORMAL HIGH (ref 70–99)
Sodium: 135 mEq/L (ref 135–145)

## 2010-11-01 LAB — DIFFERENTIAL
Basophils Absolute: 0 10*3/uL (ref 0.0–0.1)
Basophils Relative: 0 % (ref 0–1)
Eosinophils Relative: 1 % (ref 0–5)
Monocytes Absolute: 0.7 10*3/uL (ref 0.1–1.0)
Neutro Abs: 6.9 10*3/uL (ref 1.7–7.7)

## 2010-12-23 ENCOUNTER — Encounter: Payer: Self-pay | Admitting: Internal Medicine

## 2010-12-23 ENCOUNTER — Ambulatory Visit (INDEPENDENT_AMBULATORY_CARE_PROVIDER_SITE_OTHER): Payer: Self-pay | Admitting: Internal Medicine

## 2010-12-23 DIAGNOSIS — E119 Type 2 diabetes mellitus without complications: Secondary | ICD-10-CM

## 2010-12-23 DIAGNOSIS — K5289 Other specified noninfective gastroenteritis and colitis: Secondary | ICD-10-CM

## 2010-12-23 DIAGNOSIS — K529 Noninfective gastroenteritis and colitis, unspecified: Secondary | ICD-10-CM

## 2010-12-23 DIAGNOSIS — I10 Essential (primary) hypertension: Secondary | ICD-10-CM

## 2010-12-23 NOTE — Patient Instructions (Signed)
Advance diet as tolerated  Prilosec one daily  Align  One daily   Call or return to clinic prn if these symptoms worsen or fail to improve as anticipated.

## 2010-12-23 NOTE — Progress Notes (Signed)
  Subjective:    Patient ID: [name], male    DOB: [birth year], 50 y.o.   MRN: [id number]  HPI  50 year old patient who presents with a 4-5 day history of weakness nausea diarrhea and a general sense of unwellness. He is improved compared to earlier in the week there's been no recent vomiting. He remains slightly weak and anorexic. No fever or chills. He has treated hypertension and type 2 diabetes.    Review of Systems  Constitutional: Positive for activity change, appetite change and fatigue. Negative for fever and chills.  HENT: Negative for hearing loss, ear pain, congestion, sore throat, trouble swallowing, neck stiffness, dental problem, voice change and tinnitus.   Eyes: Negative for pain, discharge and visual disturbance.  Respiratory: Negative for cough, chest tightness, wheezing and stridor.   Cardiovascular: Negative for chest pain, palpitations and leg swelling.  Gastrointestinal: Positive for nausea, vomiting and diarrhea. Negative for abdominal pain, constipation, blood in stool and abdominal distention.  Genitourinary: Negative for urgency, hematuria, flank pain, discharge, difficulty urinating and genital sores.  Musculoskeletal: Negative for myalgias, back pain, joint swelling, arthralgias and gait problem.  Skin: Negative for rash.  Neurological: Negative for dizziness, syncope, speech difficulty, weakness, numbness and headaches.  Hematological: Negative for adenopathy. Does not bruise/bleed easily.  Psychiatric/Behavioral: Negative for behavioral problems and dysphoric mood. The patient is not nervous/anxious.        Objective:   Physical Exam  Constitutional: He is oriented to person, place, and time. He appears well-developed and well-nourished. No distress.  HENT:  Head: Normocephalic.  Right Ear: External ear normal.  Left Ear: External ear normal.  Eyes: Conjunctivae and EOM are normal.  Neck: Normal range of motion.  Cardiovascular: Normal rate and normal  heart sounds.   Pulmonary/Chest: Breath sounds normal.  Abdominal: Soft. Bowel sounds are normal. He exhibits no distension and no mass. There is tenderness. There is no rebound and no guarding.  Musculoskeletal: Normal range of motion. He exhibits no edema and no tenderness.  Neurological: He is alert and oriented to person, place, and time.  Psychiatric: He has a normal mood and affect. His behavior is normal.          Assessment & Plan:   Mild gastroenteritis resolving. We'll slowly advance diet. He was given samples of Align to take one daily. He will followup as scheduled for followup of his hypertension and diabetes

## 2010-12-28 NOTE — Op Note (Signed)
[name], [name]                   ACCOUNT NO.:  [id number]   MEDICAL RECORD NO.:  [id number]          PATIENT TYPE:  INP   LOCATION:  1442                         FACILITY:  [facility]   PHYSICIAN:  Jamison Neighbor, M.D.  DATE OF BIRTH:  13-Aug-1961   DATE OF PROCEDURE:  05/21/2008  DATE OF DISCHARGE:                               OPERATIVE REPORT   PREOPERATIVE DIAGNOSIS:  Left nephrolithiasis.   POSTOPERATIVE DIAGNOSIS:  Left nephrolithiasis.   PROCEDURE:  Left primary percutaneous nephrostolithotomy.   ATTENDING PHYSICIAN:  Jamison Neighbor, M.D.   RESIDENT PHYSICIAN:  Dr. Delman Kitten.   ANESTHESIA:  Was general.   INDICATIONS FOR PROCEDURE:  [name] is a 50 year old white male with  past medical history positive for nephrolithiasis.  His stones are  comprised of uric acid.  He has failed pH manipulation therapy secondary  to noncompliance and likewise has a significant stone burden in his left  kidney.  He was initially scheduled to undergo this procedure last week  but in the preoperative holding area he was found to be significantly  hyperglycemic.  This was secondary to medical noncompliance again since  he had stopped taking his oral antihyperglycemic medication without his  physician's instruction.  Subsequently his procedure was delayed.  He  was admitted to the hospital and placed on aggressive glucose control  therapy overseen by the hospitalists.  His glucose is now under control  and likewise he was deemed appropriate to proceed forth with the above  stated procedure.   PROCEDURE IN DETAIL:  The patient was brought to the operating room and  placed in a supine position.  He was correctly identified by his  wristband and appropriate time-out was taken.  IV antibiotics were  administered and general anesthesia was delivered.  A 16 French Foley  catheter was placed transurethrally into his bladder under sterile  conditions.  He was then flipped into the prone position  with great care  taken to pad all appropriate compression and pressure sites.  He already  had percutaneous access established in the interventional radiology  department last week.  The left side of his back was then prepped and  draped in normal sterile fashion and the percutaneous angiographic  catheters were prepped into the field.  We then employed our fluoroscopy  to see if we could identify his stone.  Again, secondary to the uric  acid composition the stones could not easily be seen with plain film.  We then accessed the superior most of the angiographic catheter and then  advanced a flexible tip stiff guidewire through this angiographic  catheter down into his bladder and removed the catheter leaving the  guidewire behind.  We then dilated the tract with a straight dilator.  A  second stiff guidewire was placed in an antegrade fashion down into the  bladder as well.  We then incised the skin sharply and placed our 15 cm  exfactor balloon dilator over the guidewire into the renal collecting  system again aided by fluoroscopy.  The balloon was inflated to  approximately 16  atmospheres with enough pressure to remove the waist  from the balloon.  We then advanced the access sheath over the balloon  dilator into the renal collecting system.  We then removed the balloon  and then advanced our nephroscope through the access sheath into his  renal pelvis.  His renal pelvis was decompressed.  There was no  significant hydro.  With a little bit of maneuvering during renal  pyeloscopy we encountered the large renal pelvic stone.  We hit this a  few times with the lithoclast device and it fragmented into two pieces,  one small and one large, but the stone was shaped in a way that we were  able to remove both fragments at this point.  Preoperative imaging  demonstrated two stones in particular, the second was in a lateral  calyx.  Further manipulation of our sheath and nephroscope  demonstrated  the tip of the stone entering a very narrow calyx.  We used a two  pronged grasper to free this stone up from its calyx and put it in the  renal pelvis.  We again used the lithoclast device to fragment this into  much smaller pieces.  All of the smaller pieces were then removed with a  two pronged grasper.  Final inspection did not reveal any significant  retained fragments.  We then advanced a flexible cystoscope through the  access sheath and performed panpyeloscopy and no residual stones were  appreciated either.  We then elected to place a double-J ureteral stent  over the guidewire.   The first guidewire was removed leaving only the access sheath and the  second guidewire in place.  Over the second guidewire we placed a 7 x 28  Contour double-J stent in an antegrade fashion and the distal curl was  noted to be in the bladder on fluoroscopy.  The proximal curl however  was at the level of the UPJ.  Likewise we replaced our nephroscope back  into the renal pelvis.  We were able to grasp the proximal tip of the  stent and advance it into the left renal pelvis where it curled  appropriately.  We then placed our 18 [place] tip catheter over a  wire into the left renal pelvis and inflated the balloon with a  contrast/saline mixture.  We then performed an antegrade nephrostogram.  There was no evidence of extravasation.  The balloon from the catheter  provided good tamponade to the kidney.  There was no significant  bleeding from the skin incision.  We then secured this catheter to the  skin using two sutures.  This marked the end of our procedure.  He  tolerated the procedure well.  There were no complications.  Dr. Logan Bores  was present and participated in all aspects of the case.     ______________________________  Dr Gery Pray, M.D.  Electronically Signed    DW/MEDQ  D:  05/22/2008  T:  05/22/2008  Job:  [id number]

## 2010-12-28 NOTE — Discharge Summary (Signed)
NAMEJOEDY, EICKHOFF                   ACCOUNT NO.:  [id number]   MEDICAL RECORD NO.:  [id number]          PATIENT TYPE:  INP   LOCATION:  1442                         FACILITY:  [facility]   PHYSICIAN:  Jamison Neighbor, M.D.  DATE OF BIRTH:  1961/07/14   DATE OF ADMISSION:  05/16/2008  DATE OF DISCHARGE:                               DISCHARGE SUMMARY   ADMISSION DIAGNOSIS:  Left nephrolithiasis.   DISCHARGE DIAGNOSIS:  Left nephrolithiasis.   SECONDARY DIAGNOSES:  1. Include diabetes, poorly controlled.  2. 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   PROCEDURES DURING THIS HOSPITALIZATION:  Left primary percutaneous  nephrostolithotomy on May 21, 2008.   HOSPITALIZATION DETAILS:  Mr. Donoghue is a 50 year old white male, past  medical history positive for type 2 diabetes that has been poorly  controlled.  He also has uric acid nephrolithiasis.  He has been seen by  Dr. Logan Bores for this left-sided stone burden which has been bothersome and  was recommended primary percutaneous nephrostolithotomy.  He came to the  hospital for his surgery on the day of admission but was found to be  significantly hyperglycemic.  Unfortunately he has been noncompliant  with his medications and had not taken his oral antihyperglycemics in  quite some time.  He also had stopped taking his medication to help with  pH manipulation regarding his stone burden.  Subsequently we cancelled  last week's surgery and admitted him to the floor and had the internal  medicine physicians consult and help manage his diabetes.  This was  quickly brought under control and subsequently he went for his surgery  on May 21, 2008.  This procedure went well without any complication.  He went to the floor with an 18-French council tip catheter acting as a  nephrostomy tube as well as an angiographic catheter in the lower pole  collecting system.  Both of these access sites had been accessed  by  interventional radiology on the day of admission.  He did well  postoperatively.  His hemoglobin was stable.  His pain was well-  controlled by the time he was discharged.  He was tolerating a regular  diet and was ambulatory, afebrile and was able to perform activities of  daily living with minimal discomfort.  Subsequently on the day  discharged he is clinically stable and it is our impression as a  surgical service that he has maximized his benefit as an inpatient here  at [facility] and will be discharged home appropriately.   DISCHARGE MEDICATIONS:  Please see medication reconciliation sheet.   DISCHARGE DIET:  Diabetic/kidney stone diet as tolerated.   RECOMMENDATIONS:  1. He needs followup with his primary care physician or the physician      who manages his diabetes for close monitoring.  He will also have      outpatient diabetes education as well.  2. Both his nephrostomy tube and angiographic catheter are both      capped.  He has a dressing on them now and he was  informed that it      is okay for him to shower but not to bath.  His dressing will get      wet and likewise will need to be changed accordingly.  He was given      instructions on how to do this prior to discharge.   Other restrictions include no lifting for 2 weeks, no driving for 2  weeks, and he will not be allowed to return to work until both of his  tubes have been removed by Dr. Logan Bores and he is clinically stable.   Call or return to clinic if he develops fever greater than 101.5,  uncontrolled pain, persistent nausea or vomiting, significant hematuria  or other concerns or questions arise.   FOLLOW-UP APPOINTMENTS:  He will be set up to follow up with Dr. Logan Bores  next week in the clinic for removal of his nephrostomy tube and his  angiographic catheter.  One week after that he will again be seen by Dr.  Logan Bores to have his double-J ureteral stent removed in the clinic.      Delman Kitten,  MD      Jamison Neighbor, M.D.  Electronically Signed    DW/MEDQ  D:  05/23/2008  T:  05/23/2008  Job:  [id number]

## 2010-12-31 NOTE — Discharge Summary (Signed)
[name], [name]                   ACCOUNT NO.:  [id number]   MEDICAL RECORD NO.:  [id number]          PATIENT TYPE:  INP   LOCATION:  0349                         FACILITY:  [facility]   PHYSICIAN:  Jamison Neighbor, M.D.  DATE OF BIRTH:  1960/08/17   DATE OF ADMISSION:  08/24/2004  DATE OF DISCHARGE:  08/27/2004                                 DISCHARGE SUMMARY   DISCHARGE DIAGNOSES:  1.  Calculus of the kidney.  2.  Diabetes mellitus.   PRINCIPAL PROCEDURE:  Percutaneous nephrolithotripsy done on the date of  admission.   HISTORY:  This 50 year old male is felt to have larger __________ stones on  the right-hand side.  The patient has stones that could not be well  visualized in any way other than CT.  It is felt that these stones need to  be removed.  He came to the hospital for placement of left percutaneous  nephrostomy tube and then will be admitted to undergo percutaneous left  nephrolithotripsy.   The patient was seen in radiology and they placed a left percutaneous  nephrostomy tube.  This device easily gave access to the mid pole area where  the stone was present as well as allowing access into the pelvis where  another stone was seen.  The patient was taken to the operating room where  he underwent left percutaneous nephrolithotripsy and really did quite  nicely.  The Foley catheter was left in place and had nephrostomy after the  procedure was completed.  The patient was rapidly advanced to a regular  diet.  His laboratory studies were checked.  There were no problems with  significant blood loss.  He did develop a temperature of 101.5 with some  associated sinus tachycardia and was evaluated with additional laboratory  studies as well as telemetry.  The patient stabilized quite nicely and is  felt to be stable for discharge by August 27, 2004.  The patient will  return for a follow-up x-ray study to make sure that all stone fragments are  gone and that good healing has  occurred and then will have the nephrostomy  tube removed.  A double J was not left in place during this procedure.      RJE/MEDQ  D:  09/14/2004  T:  09/14/2004  Job:  [id number]

## 2010-12-31 NOTE — H&P (Signed)
[name], [name]                   ACCOUNT NO.:  [id number]   MEDICAL RECORD NO.:  [id number]          PATIENT TYPE:  INP   LOCATION:  0349                         FACILITY:  [facility]   PHYSICIAN:  Jamison Neighbor, M.D.  DATE OF BIRTH:  08/06/1961   DATE OF ADMISSION:  08/24/2004  DATE OF DISCHARGE:  08/27/2004                                HISTORY & PHYSICAL   HISTORY:  This 50 year old male was known to have two stones in the left  kidney that could not be treated with __________ because they are made of  uric acid,  could not be well visualized.  The patient has been on  medication trying to resolve these, but still has obvious stone fragments.  He is now to undergo left percutaneous nephrolithotripsy.  The patient was  brought to the hospital for placement of the nephrostomy tube in radiology  and then was taken to the operating room to undergo the procedure.   PAST MEDICAL HISTORY:  Remarkable for diabetes.  He is on Metformin 500 mg  b.i.d.  He is also on Naprosyn.  We have been trying to treat his stones  with allopurinol and uric acid without much improvement.  The patient is  status post vasectomy.  He is also previous ESWL but the stone could not be  well visualized.   Patient's past medical history is otherwise unremarkable.   SOCIAL HISTORY:  He does not use alcohol or substance of any kind and has  never had tobacco.   FAMILY HISTORY:  Noncontributory.   REVIEW OF SYSTEMS:  Noncontributory.   PHYSICAL EXAMINATION:  GENERAL:  He is a well-developed, well-nourished male  in no acute distress.  HEENT:  Normocephalic, atraumatic.  Cranial nerves II-XII are grossly  intact.  NECK:  Supple.  No adenopathy or thyromegaly.  LUNGS:  Clear.  HEART:  Regular rate and rhythm without murmurs, thrills, gallops, rubs,  heaves.  ABDOMEN:  Soft, nontender.  No palpable masses, rebound, or guarding.  GENITOURINARY:  Testicles are normal size.  __________ no hydrocele,  __________  hernia or adenopathy.  EXTREMITIES:  The patient had no clubbing, cyanosis, edema.  Peripheral  pulses are normal.  SKIN:  Normal.  NEUROLOGIC:  Intact.   IMPRESSION:  Right renal calculi.   PLAN:  Admit following placement of nephrostomy tube so patient can undergo  left percutaneous nephrolithotripsy in the OR.    RJE/MEDQ  D:  09/14/2004  T:  09/14/2004  Job:  [id number]

## 2011-01-20 IMAGING — CR DG CHEST 2V
2 series · 2 of 2 positions shown · non-contrast
Comparison: 09/12/2009

CLINICAL DATA: Chest pain

CHEST - 2 VIEW

[w chest pa]
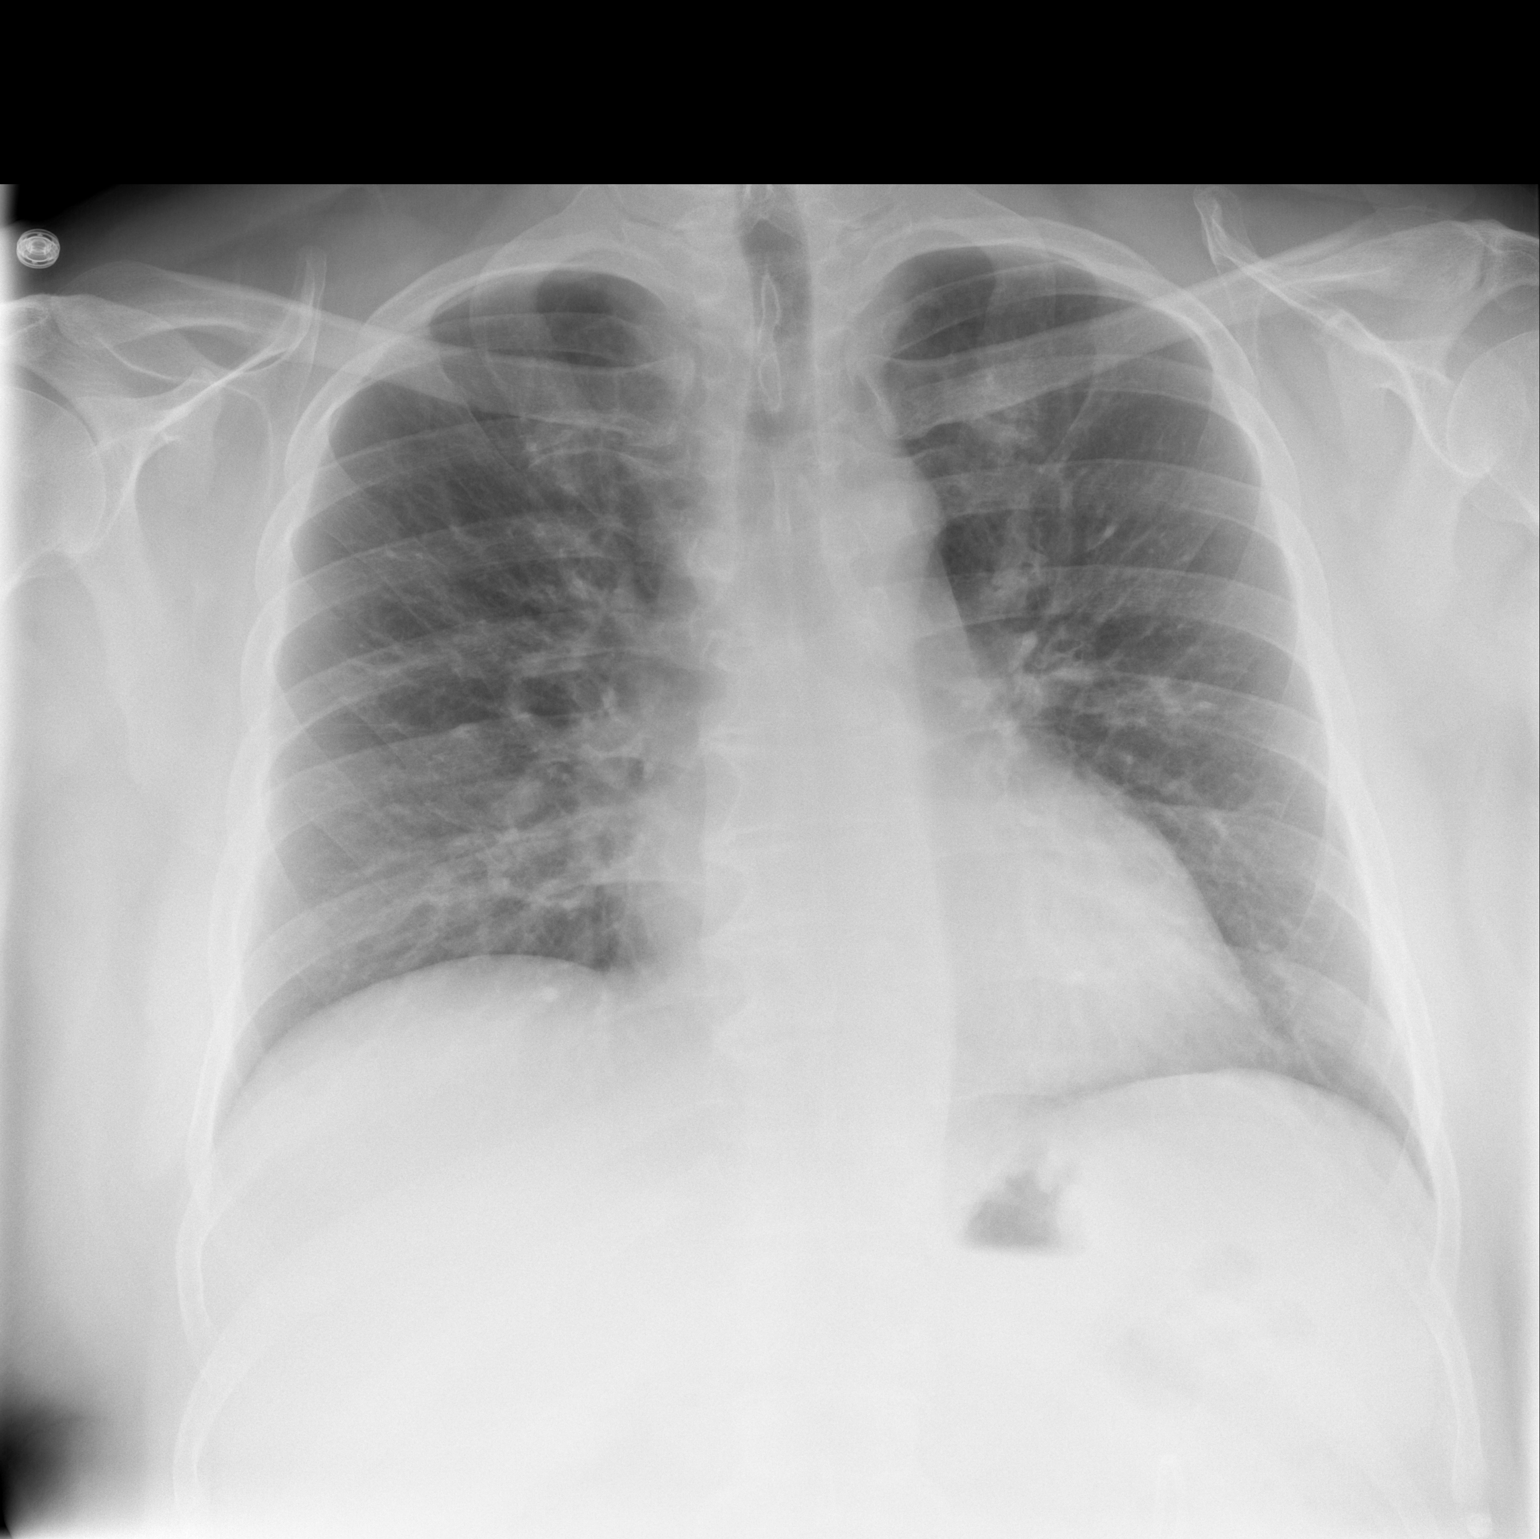

[w chest lat]
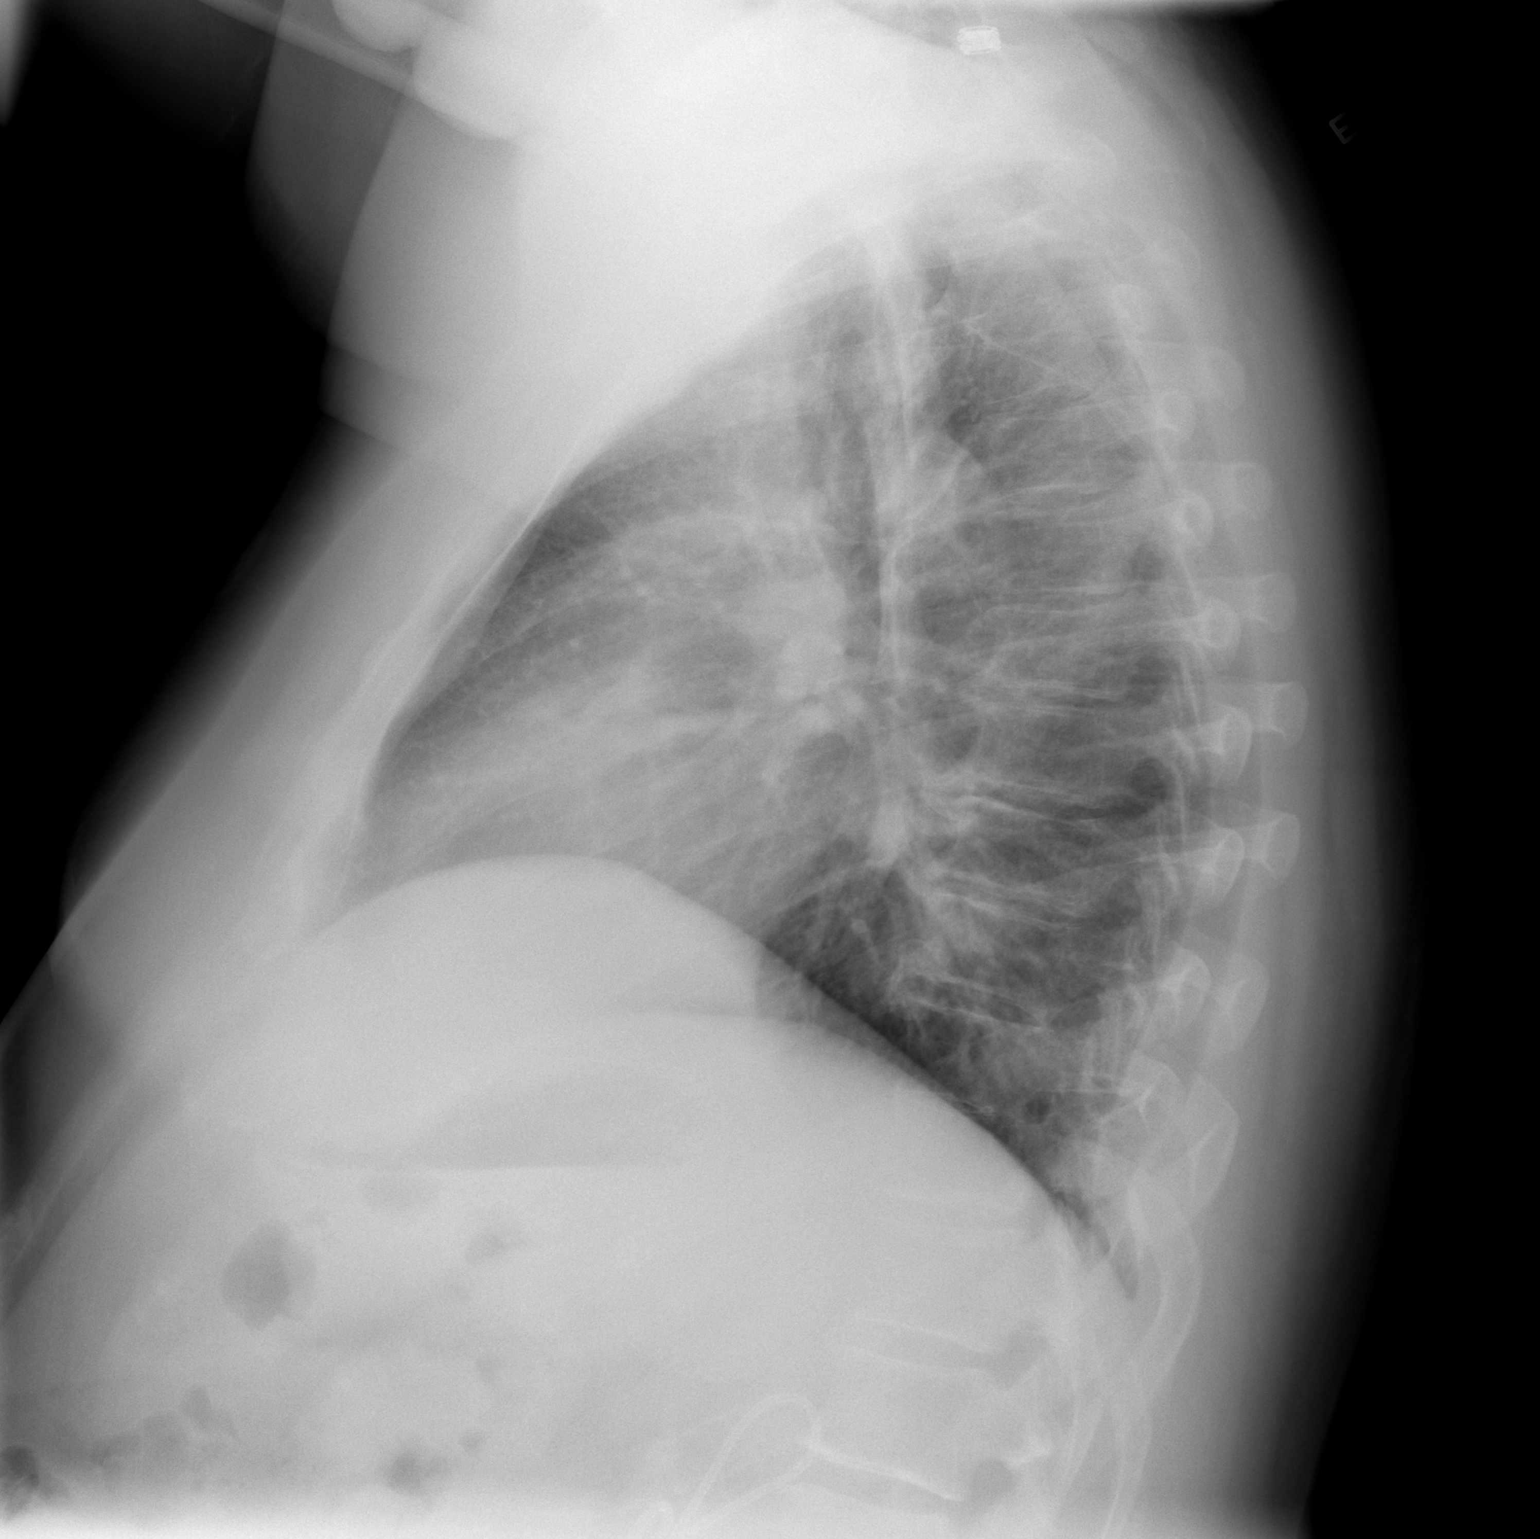

[2 of 2 positions shown; findings below may reference images not displayed]

FINDINGS: Heart size within normal limits.  No definite congestive
heart failure or pleural fluid.  There is central airway thickening
with mildly accentuated markings that are likely chronic bronchitic
type changes.  Osseous structures intact.
IMPRESSION: Bronchitic changes with no definite congestive heart failure or
active disease.

## 2011-02-05 IMAGING — CR DG CHEST 1V PORT
1 series · 1 of 1 positions shown · non-contrast
Comparison: 02/01/2010

CLINICAL DATA: Chest pain

PORTABLE CHEST - 1 VIEW

[AP]
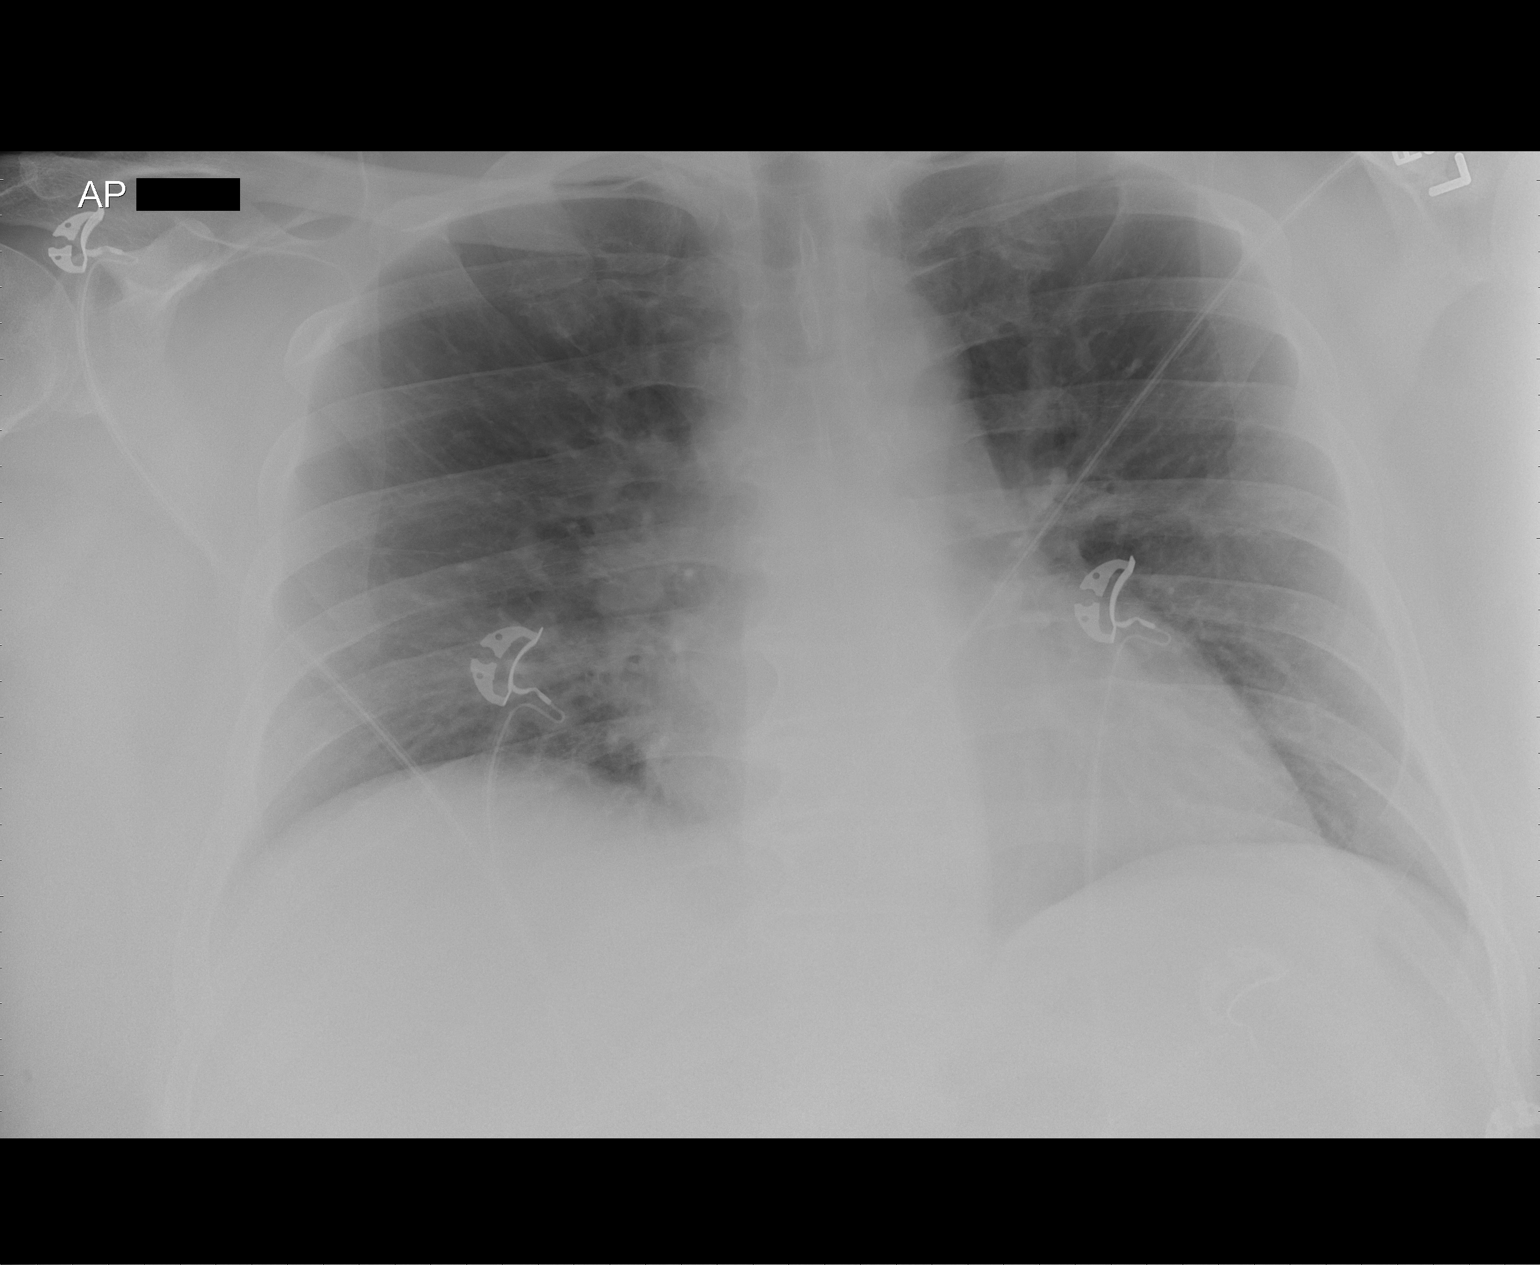

[1 of 1 positions shown; findings below may reference images not displayed]

FINDINGS: Heart size within normal limits.  Mild volume loss at the
right base but no definite pneumonia or pleural fluid in one-view.
IMPRESSION: Volume loss of the right lung base - no definite pneumonia or heart
failure in one-view.

## 2011-02-14 ENCOUNTER — Other Ambulatory Visit: Payer: Self-pay | Admitting: Internal Medicine

## 2011-02-17 ENCOUNTER — Other Ambulatory Visit: Payer: Self-pay | Admitting: Internal Medicine

## 2011-02-17 MED ORDER — METFORMIN HCL 1000 MG PO TABS: 1000.0000 mg | ORAL_TABLET | Freq: Two times a day (BID) | ORAL | Status: AC

## 2011-02-17 NOTE — Telephone Encounter (Signed)
Done

## 2011-02-17 NOTE — Telephone Encounter (Signed)
Needs new rx for Metformin 1000mg  bid #180.

## 2011-05-16 LAB — GLUCOSE, CAPILLARY
Glucose-Capillary: 114 — ABNORMAL HIGH
Glucose-Capillary: 116 — ABNORMAL HIGH
Glucose-Capillary: 129 — ABNORMAL HIGH
Glucose-Capillary: 141 — ABNORMAL HIGH
Glucose-Capillary: 151 — ABNORMAL HIGH
Glucose-Capillary: 158 — ABNORMAL HIGH
Glucose-Capillary: 173 — ABNORMAL HIGH
Glucose-Capillary: 174 — ABNORMAL HIGH
Glucose-Capillary: 188 — ABNORMAL HIGH
Glucose-Capillary: 192 — ABNORMAL HIGH
Glucose-Capillary: 193 — ABNORMAL HIGH
Glucose-Capillary: 196 — ABNORMAL HIGH
Glucose-Capillary: 204 — ABNORMAL HIGH
Glucose-Capillary: 208 — ABNORMAL HIGH
Glucose-Capillary: 212 — ABNORMAL HIGH
Glucose-Capillary: 212 — ABNORMAL HIGH
Glucose-Capillary: 242 — ABNORMAL HIGH
Glucose-Capillary: 250 — ABNORMAL HIGH
Glucose-Capillary: 258 — ABNORMAL HIGH
Glucose-Capillary: 295 — ABNORMAL HIGH
Glucose-Capillary: 298 — ABNORMAL HIGH
Glucose-Capillary: 315 — ABNORMAL HIGH
Glucose-Capillary: 328 — ABNORMAL HIGH
Glucose-Capillary: 70
Glucose-Capillary: 71
Glucose-Capillary: 72
Glucose-Capillary: 89
Glucose-Capillary: 92
Glucose-Capillary: 96
Glucose-Capillary: 99
Glucose-Capillary: 99

## 2011-05-16 LAB — POCT I-STAT 4, (NA,K, GLUC, HGB,HCT)
HCT: 33 — ABNORMAL LOW
Hemoglobin: 11.2 — ABNORMAL LOW
Potassium: 3.9

## 2011-05-16 LAB — BASIC METABOLIC PANEL
CO2: 26
Calcium: 9.1
Chloride: 104
GFR calc Af Amer: >60
GFR calc Af Amer: >60
GFR calc non Af Amer: >60
Sodium: 133 — ABNORMAL LOW
Sodium: 139

## 2011-05-16 LAB — COMPREHENSIVE METABOLIC PANEL
Albumin: 2.9 — ABNORMAL LOW
BUN: 13
Calcium: 8.7
Glucose, Bld: 127 — ABNORMAL HIGH
Total Protein: 6.8

## 2011-05-16 LAB — CBC
HCT: 44.8
Hemoglobin: 14.1
Hemoglobin: 15
MCHC: 33.4
MCV: 95.3
RBC: 4.44
RBC: 4.7
RDW: 13.5

## 2011-05-16 LAB — HEMOGLOBIN AND HEMATOCRIT, BLOOD
HCT: 33.3 — ABNORMAL LOW
Hemoglobin: 11.3 — ABNORMAL LOW
Hemoglobin: 11.5 — ABNORMAL LOW

## 2011-05-16 LAB — HEMOGLOBIN A1C: Mean Plasma Glucose: 298

## 2012-07-09 ENCOUNTER — Emergency Department: Payer: Self-pay | Admitting: Emergency Medicine

## 2012-07-09 LAB — URINALYSIS, COMPLETE
Bacteria: NONE SEEN
Leukocyte Esterase: NEGATIVE
Nitrite: NEGATIVE
Ph: 5 (ref 4.5–8.0)
Protein: NEGATIVE
RBC,UR: 7 /HPF (ref 0–5)
Specific Gravity: 1.032 (ref 1.003–1.030)
Squamous Epithelial: <1
WBC UR: 4 /HPF (ref 0–5)

## 2012-07-09 LAB — CBC
HGB: 15.4 g/dL (ref 13.0–18.0)
MCH: 32.2 pg (ref 26.0–34.0)
RBC: 4.77 10*6/uL (ref 4.40–5.90)

## 2012-07-09 LAB — COMPREHENSIVE METABOLIC PANEL
Anion Gap: 10 (ref 7–16)
BUN: 14 mg/dL (ref 7–18)
Bilirubin,Total: 0.5 mg/dL (ref 0.2–1.0)
Chloride: 101 mmol/L (ref 98–107)
Co2: 23 mmol/L (ref 21–32)
Creatinine: 1.08 mg/dL (ref 0.60–1.30)
EGFR (African American): >60
Osmolality: 289 (ref 275–301)
Potassium: 4.3 mmol/L (ref 3.5–5.1)
Total Protein: 8.2 g/dL (ref 6.4–8.2)

## 2012-07-09 LAB — LIPASE, BLOOD: Lipase: 312 U/L (ref 73–393)

## 2012-12-31 ENCOUNTER — Emergency Department: Payer: Self-pay | Admitting: Emergency Medicine

## 2012-12-31 LAB — URINALYSIS, COMPLETE
Bacteria: NONE SEEN
Bilirubin,UR: NEGATIVE
Glucose,UR: >=500 mg/dL (ref 0–75)
Leukocyte Esterase: NEGATIVE
Nitrite: NEGATIVE
Ph: 6 (ref 4.5–8.0)
Specific Gravity: 1.033 (ref 1.003–1.030)

## 2012-12-31 LAB — COMPREHENSIVE METABOLIC PANEL
Albumin: 3.4 g/dL (ref 3.4–5.0)
BUN: 10 mg/dL (ref 7–18)
Calcium, Total: 9.2 mg/dL (ref 8.5–10.1)
EGFR (Non-African Amer.): >60
Sodium: 134 mmol/L — ABNORMAL LOW (ref 136–145)
Total Protein: 8.2 g/dL (ref 6.4–8.2)

## 2012-12-31 LAB — PROTIME-INR: Prothrombin Time: 13.3 secs (ref 11.5–14.7)

## 2012-12-31 LAB — CBC
HCT: 44.4 % (ref 40.0–52.0)
HGB: 14.7 g/dL (ref 13.0–18.0)
MCH: 30.9 pg (ref 26.0–34.0)
Platelet: 246 10*3/uL (ref 150–440)
WBC: 11.7 10*3/uL — ABNORMAL HIGH (ref 3.8–10.6)

## 2013-11-11 ENCOUNTER — Emergency Department: Payer: Self-pay | Admitting: Emergency Medicine

## 2013-11-11 LAB — CBC WITH DIFFERENTIAL/PLATELET
Basophil #: 0.1 10*3/uL (ref 0.0–0.1)
Basophil %: 0.7 %
EOS ABS: 0.1 10*3/uL (ref 0.0–0.7)
Eosinophil %: 1 %
HCT: 45.5 % (ref 40.0–52.0)
HGB: 15.3 g/dL (ref 13.0–18.0)
LYMPHS PCT: 31.3 %
Lymphocyte #: 3.3 10*3/uL (ref 1.0–3.6)
MCH: 31.7 pg (ref 26.0–34.0)
MCHC: 33.6 g/dL (ref 32.0–36.0)
MCV: 94 fL (ref 80–100)
MONOS PCT: 6.6 %
Monocyte #: 0.7 x10 3/mm (ref 0.2–1.0)
NEUTROS ABS: 6.4 10*3/uL (ref 1.4–6.5)
NEUTROS PCT: 60.4 %
PLATELETS: 225 10*3/uL (ref 150–440)
RBC: 4.83 10*6/uL (ref 4.40–5.90)
RDW: 13.7 % (ref 11.5–14.5)
WBC: 10.5 10*3/uL (ref 3.8–10.6)

## 2013-11-11 LAB — COMPREHENSIVE METABOLIC PANEL
ALK PHOS: 180 U/L — AB
AST: 16 U/L (ref 15–37)
Albumin: 3.2 g/dL — ABNORMAL LOW (ref 3.4–5.0)
Anion Gap: 5 — ABNORMAL LOW (ref 7–16)
BUN: 11 mg/dL (ref 7–18)
Bilirubin,Total: 0.4 mg/dL (ref 0.2–1.0)
CHLORIDE: 99 mmol/L (ref 98–107)
CREATININE: 0.94 mg/dL (ref 0.60–1.30)
Calcium, Total: 9.2 mg/dL (ref 8.5–10.1)
Co2: 28 mmol/L (ref 21–32)
EGFR (Non-African Amer.): >60
Glucose: 453 mg/dL — ABNORMAL HIGH (ref 65–99)
Osmolality: 284 (ref 275–301)
POTASSIUM: 4.3 mmol/L (ref 3.5–5.1)
SGPT (ALT): 27 U/L (ref 12–78)
Sodium: 132 mmol/L — ABNORMAL LOW (ref 136–145)
Total Protein: 8.5 g/dL — ABNORMAL HIGH (ref 6.4–8.2)

## 2013-11-11 LAB — URINALYSIS, COMPLETE
Bacteria: NONE SEEN
Bilirubin,UR: NEGATIVE
Blood: NEGATIVE
Glucose,UR: >=500 mg/dL (ref 0–75)
Ketone: NEGATIVE
LEUKOCYTE ESTERASE: NEGATIVE
Nitrite: NEGATIVE
PH: 5 (ref 4.5–8.0)
Protein: 100
SPECIFIC GRAVITY: 1.029 (ref 1.003–1.030)
Squamous Epithelial: NONE SEEN
WBC UR: 1 /HPF (ref 0–5)

## 2013-11-11 LAB — LIPASE, BLOOD: LIPASE: 412 U/L — AB (ref 73–393)

## 2014-03-27 ENCOUNTER — Emergency Department: Payer: Self-pay | Admitting: Emergency Medicine

## 2014-03-27 LAB — COMPREHENSIVE METABOLIC PANEL
Albumin: 2.8 g/dL — ABNORMAL LOW (ref 3.4–5.0)
Alkaline Phosphatase: 129 U/L — ABNORMAL HIGH
Anion Gap: 6 — ABNORMAL LOW (ref 7–16)
BUN: 19 mg/dL — ABNORMAL HIGH (ref 7–18)
Bilirubin,Total: 0.4 mg/dL (ref 0.2–1.0)
Calcium, Total: 9.2 mg/dL (ref 8.5–10.1)
Chloride: 103 mmol/L (ref 98–107)
Co2: 27 mmol/L (ref 21–32)
Creatinine: 1 mg/dL (ref 0.60–1.30)
EGFR (African American): >60
EGFR (Non-African Amer.): >60
Glucose: 441 mg/dL — ABNORMAL HIGH (ref 65–99)
Osmolality: 293 (ref 275–301)
Potassium: 4.3 mmol/L (ref 3.5–5.1)
SGOT(AST): 19 U/L (ref 15–37)
SGPT (ALT): 24 U/L
Sodium: 136 mmol/L (ref 136–145)
Total Protein: 7 g/dL (ref 6.4–8.2)

## 2014-03-27 LAB — CBC WITH DIFFERENTIAL/PLATELET
BASOS ABS: 0 10*3/uL (ref 0.0–0.1)
Basophil %: 0.3 %
Eosinophil #: 0.1 10*3/uL (ref 0.0–0.7)
Eosinophil %: 1.4 %
HCT: 43.1 % (ref 40.0–52.0)
HGB: 14.1 g/dL (ref 13.0–18.0)
LYMPHS ABS: 2.7 10*3/uL (ref 1.0–3.6)
Lymphocyte %: 27.7 %
MCH: 31.6 pg (ref 26.0–34.0)
MCHC: 32.7 g/dL (ref 32.0–36.0)
MCV: 97 fL (ref 80–100)
MONO ABS: 0.7 x10 3/mm (ref 0.2–1.0)
MONOS PCT: 6.9 %
NEUTROS ABS: 6.3 10*3/uL (ref 1.4–6.5)
NEUTROS PCT: 63.7 %
Platelet: 194 10*3/uL (ref 150–440)
RBC: 4.46 10*6/uL (ref 4.40–5.90)
RDW: 13.7 % (ref 11.5–14.5)
WBC: 9.9 10*3/uL (ref 3.8–10.6)

## 2014-03-27 LAB — LACTATE DEHYDROGENASE: LDH: 215 U/L (ref 85–241)

## 2014-03-27 LAB — LIPASE, BLOOD: LIPASE: 618 U/L — AB (ref 73–393)

## 2014-03-27 LAB — TROPONIN I: Troponin-I: <0.02 ng/mL

## 2014-05-02 ENCOUNTER — Emergency Department: Payer: Self-pay | Admitting: Emergency Medicine

## 2014-05-26 ENCOUNTER — Encounter: Payer: Self-pay | Admitting: Family Medicine

## 2014-05-26 ENCOUNTER — Ambulatory Visit (INDEPENDENT_AMBULATORY_CARE_PROVIDER_SITE_OTHER): Payer: Self-pay | Admitting: Family Medicine

## 2014-05-26 VITALS — BP 142/89 | HR 112 | Ht 69.0 in | Wt 238.0 lb

## 2014-05-26 DIAGNOSIS — S8992XA Unspecified injury of left lower leg, initial encounter: Secondary | ICD-10-CM

## 2014-05-26 NOTE — Patient Instructions (Signed)
You have a quad strain. Your ultrasound only shows very mild amount of increased fluid around the rectus femoris muscle. The quad tendon is intact and I do not appreciate a tear by ultrasound. Discuss with Thayer Ohmhris re: how he wants you to proceed given you also have fibular and ankle fractures. It's likely you will need physical therapy once these heal so you can regain your strength in the quad. You can start with quad sets (flexing), straight leg raises, and knee extension exercises 3 sets of 10 if he is ok with this. Follow up with us as needed.

## 2014-05-27 ENCOUNTER — Encounter: Payer: Self-pay | Admitting: Family Medicine

## 2014-05-27 DIAGNOSIS — S8992XA Unspecified injury of left lower leg, initial encounter: Secondary | ICD-10-CM | POA: Insufficient documentation

## 2014-05-27 NOTE — Assessment & Plan Note (Addendum)
No evidence of quadriceps tendon tear.  He does however have slight increased fluid surrounding rectus consistent with strain, probable very small tear not visualized by ultrasound.  No complete thickness tears to warrant surgical intervention however.  Images saved.  Will forward this to Cranston Neighborhris Gaines at [facility] to discuss further treatment.  [name] and I did discuss typically would start quad sets, straight leg raises, possible physical therapy though treatment is also complicated by his two fibular fractures.  He will otherwise follow up with us as needed.

## 2014-05-27 NOTE — Progress Notes (Signed)
Patient ID: [name], male   DOB: 20-Nov-1960, 53 y.o.   MRN: [id number]  PCP: [name] Referred by [name]  Subjective:   HPI: Patient is a 53 y.o. male here for left knee injury.  Patient reports near end of August or early September he was at the beach when he believes his left ankle buckled and he injured it. Improved for the most part until 9/18 when he stepped in a hole and severely inverted left ankle, fell down. Per notes he suffered a nondisplaced lateral malleolus fracture and proximal fibula fracture. He is been weight bearing as tolerated in [facility] However he's stated he's having increased buckling and weakness in left knee and had a fall about a week ago. There is concern for a possible quad tendon or quad muscle tear - referred to us for ultrasound. Patient reports he has had this knee drained as well.  Placed in immobilizer today.  Past Medical History  Diagnosis Date  . HYPERLIPIDEMIA 02/09/2010  . ERECTILE DYSFUNCTION 11/08/2007  . DEPRESSIVE DISORDER 02/18/2010  . HYPERTENSION 01/06/2009  . GERD 02/18/2010    Current Outpatient Prescriptions on File Prior to Visit  Medication Sig Dispense Refill  . allopurinol (ZYLOPRIM) 300 MG tablet Take 300 mg by mouth daily.        Marland Kitchen. aspirin 81 MG tablet Take 81 mg by mouth daily.        . citalopram (CELEXA) 20 MG tablet Take 20 mg by mouth daily.        Marland Kitchen. glimepiride (AMARYL) 4 MG tablet Take 4 mg by mouth daily before breakfast.        . glucose blood test strip 1 each by Other route daily. Use as instructed       . losartan (COZAAR) 50 MG tablet TAKE 1 TABLET BY MOUTH DAILY  90 tablet  3  . metFORMIN (GLUCOPHAGE) 1000 MG tablet Take 1 tablet (1,000 mg total) by mouth 2 (two) times daily with a meal.  180 tablet  2  . sildenafil (VIAGRA) 100 MG tablet Take 1 tablet (100 mg total) by mouth as needed for Erectile Dysfunction (daily).  12 tablet  2  . simvastatin (ZOCOR) 40 MG tablet Take 40 mg by mouth at  bedtime.        . tadalafil (CIALIS) 20 MG tablet Take 20 mg by mouth daily as needed.         No current facility-administered medications on file prior to visit.    Past Surgical History  Procedure Laterality Date  . Lithotripsy  2006    status post left pertaneous nephro  . Cardiac cart  2011    No Known Allergies  History   Social History  . Marital Status: Married    Spouse Name: N/A    Number of Children: N/A  . Years of Education: N/A   Occupational History  . Not on file.   Social History Main Topics  . Smoking status: Never Smoker   . Smokeless tobacco: Not on file  . Alcohol Use: Not on file  . Drug Use: Not on file  . Sexual Activity: Not on file   Other Topics Concern  . Not on file   Social History Narrative  . No narrative on file    Family History  Problem Relation Age of Onset  . Diabetes Mother     type ll  . Cancer Father 2070    lung  . Hypertension Other   .  Diabetes Other   . Hyperlipidemia Other     BP 142/89  Pulse 112  Ht 5\' 9"  (1.753 m)  Wt 238 lb (107.956 kg)  BMI 35.13 kg/m2  Review of Systems: See HPI above.    Objective:  Physical Exam:  Gen: NAD  Left knee: Mild effusion.  No redness, bruising. TTP suprapatellar pouch, quad tendon, distal quad diffusely.  No palpable defect. Patellar tendon without defect either. He is able to actively extend fully though strength is 4/5, has pain.  Flexion only to about 90 degrees.  MSK u/s:  Small effusion confirmed within knee.  Entire Quad tendon appears intact without areas of hypoechogenicity or increased neovascularity.  Rectus femoris without visualized muscle tear though slight increased fluid surrounding this muscle.  No tears visualized of distal half of vastus muscles either.    Assessment & Plan:  1. Left quad strain - No evidence of quadriceps tendon tear.  He does however have slight increased fluid surrounding rectus consistent with strain, probable very small tear  not visualized by ultrasound.  No complete thickness tears to warrant surgical intervention however.  Images saved.  Will forward this to Anthony [facility]ris G[facility] at [facility] to discuss further treatment.  [name] and I did discuss typically would start quad sets, straight leg raises, possible physical therapy though treatment is also complicated by his two fibular fractures.  He will otherwise follow up with us as needed.

## 2014-06-30 ENCOUNTER — Emergency Department: Payer: Self-pay | Admitting: Emergency Medicine

## 2014-06-30 LAB — CBC WITH DIFFERENTIAL/PLATELET
Basophil #: 0 10*3/uL (ref 0.0–0.1)
Basophil %: 0.4 %
Eosinophil #: 0.1 10*3/uL (ref 0.0–0.7)
Eosinophil %: 0.7 %
HCT: 41.2 % (ref 40.0–52.0)
HGB: 13.4 g/dL (ref 13.0–18.0)
Lymphocyte #: 2.7 10*3/uL (ref 1.0–3.6)
Lymphocyte %: 22.9 %
MCH: 30.9 pg (ref 26.0–34.0)
MCHC: 32.6 g/dL (ref 32.0–36.0)
MCV: 95 fL (ref 80–100)
MONO ABS: 0.7 x10 3/mm (ref 0.2–1.0)
Monocyte %: 6 %
NEUTROS ABS: 8.3 10*3/uL — AB (ref 1.4–6.5)
Neutrophil %: 70 %
Platelet: 269 10*3/uL (ref 150–440)
RBC: 4.35 10*6/uL — ABNORMAL LOW (ref 4.40–5.90)
RDW: 13.5 % (ref 11.5–14.5)
WBC: 11.9 10*3/uL — ABNORMAL HIGH (ref 3.8–10.6)

## 2014-06-30 LAB — BASIC METABOLIC PANEL
ANION GAP: 8 (ref 7–16)
BUN: 12 mg/dL (ref 7–18)
CALCIUM: 8.9 mg/dL (ref 8.5–10.1)
CO2: 27 mmol/L (ref 21–32)
Chloride: 102 mmol/L (ref 98–107)
Creatinine: 0.91 mg/dL (ref 0.60–1.30)
GLUCOSE: 378 mg/dL — AB (ref 65–99)
Osmolality: 289 (ref 275–301)
Potassium: 3.6 mmol/L (ref 3.5–5.1)
Sodium: 137 mmol/L (ref 136–145)

## 2014-10-06 ENCOUNTER — Emergency Department: Payer: Self-pay | Admitting: Emergency Medicine
